# Patient Record
Sex: Female | Born: 1975 | Race: Black or African American | Hispanic: No | Marital: Married | Smoking: Never smoker
Health system: Southern US, Community
[De-identification: ages and names within clinical notes are randomized; demographics above are authoritative.]

## PROBLEM LIST (undated history)

## (undated) DIAGNOSIS — E785 Hyperlipidemia, unspecified: Secondary | ICD-10-CM

## (undated) DIAGNOSIS — Z973 Presence of spectacles and contact lenses: Secondary | ICD-10-CM

## (undated) DIAGNOSIS — Z9109 Other allergy status, other than to drugs and biological substances: Secondary | ICD-10-CM

## (undated) DIAGNOSIS — D509 Iron deficiency anemia, unspecified: Secondary | ICD-10-CM

## (undated) DIAGNOSIS — N840 Polyp of corpus uteri: Secondary | ICD-10-CM

## (undated) DIAGNOSIS — K219 Gastro-esophageal reflux disease without esophagitis: Secondary | ICD-10-CM

## (undated) DIAGNOSIS — E119 Type 2 diabetes mellitus without complications: Secondary | ICD-10-CM

## (undated) DIAGNOSIS — D649 Anemia, unspecified: Secondary | ICD-10-CM

## (undated) HISTORY — PX: TONSILLECTOMY AND ADENOIDECTOMY: SHX28

## (undated) HISTORY — DX: Hyperlipidemia, unspecified: E78.5

## (undated) HISTORY — PX: DILATION AND CURETTAGE OF UTERUS: SHX78

## (undated) HISTORY — PX: LAPAROSCOPIC CHOLECYSTECTOMY: SUR755

## (undated) HISTORY — DX: Morbid (severe) obesity due to excess calories: E66.01

## (undated) HISTORY — PX: HYSTEROSCOPY WITH RESECTOSCOPE: SHX5395

## (undated) HISTORY — DX: Polyp of corpus uteri: N84.0

## (undated) HISTORY — PX: CHOLECYSTECTOMY: SHX55

---

## 2003-03-02 ENCOUNTER — Other Ambulatory Visit: Admission: RE | Admit: 2003-03-02 | Discharge: 2003-03-02 | Payer: Self-pay | Admitting: Obstetrics and Gynecology

## 2008-09-28 ENCOUNTER — Encounter (INDEPENDENT_AMBULATORY_CARE_PROVIDER_SITE_OTHER): Payer: Self-pay | Admitting: General Surgery

## 2008-09-28 ENCOUNTER — Ambulatory Visit (HOSPITAL_COMMUNITY): Admission: RE | Admit: 2008-09-28 | Discharge: 2008-09-28 | Payer: Self-pay | Admitting: General Surgery

## 2009-06-22 IMAGING — RF DG CHOLANGIOGRAM OPERATIVE
1 series · 8 of 8 positions shown · non-contrast
Comparison: None available.

CLINICAL DATA: :  Cholelithiasis.  Cholecystectomy.

INTRAOPERATIVE CHOLANGIOGRAM
TECHNIQUE: Multiple fluoroscopic spot radiographs were obtained
during intraoperative cholangiogram and are submitted for
interpretation post-operatively.

[Series 0: selected · 8 of 8 slices shown]
[im 1/8]
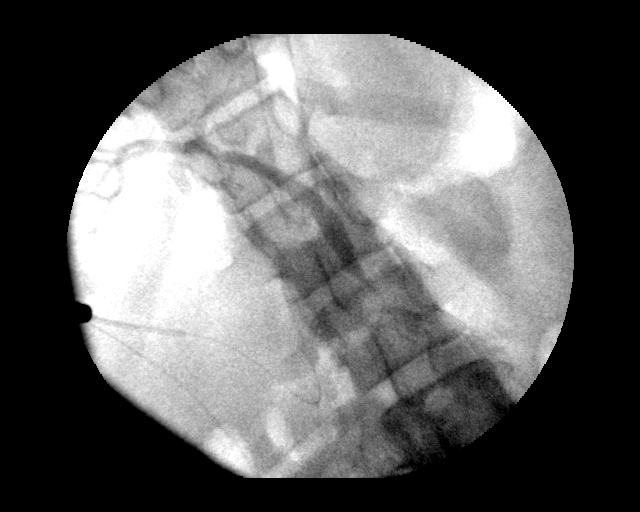
[im 2/8]
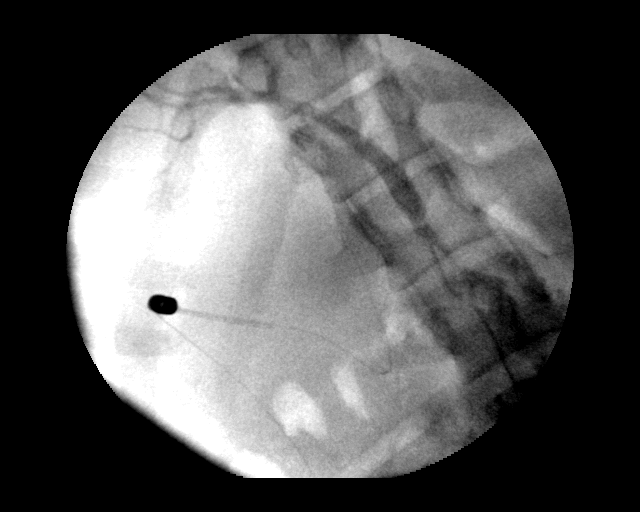
[im 3/8]
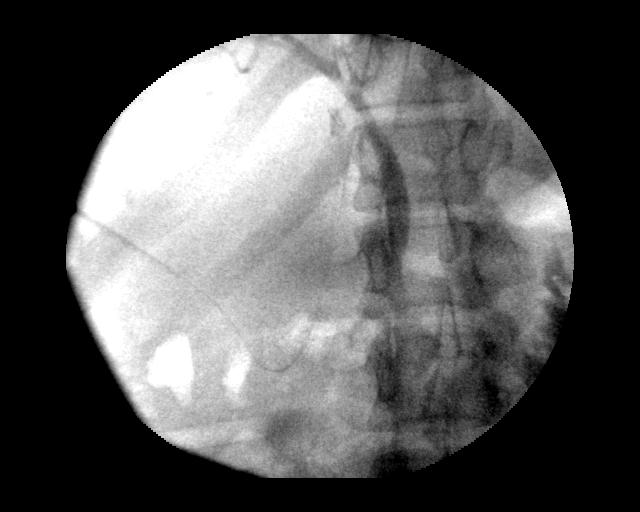
[im 4/8]
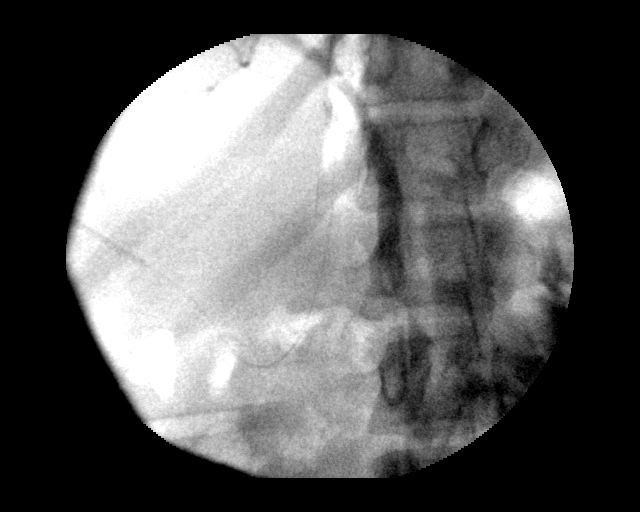
[im 5/8]
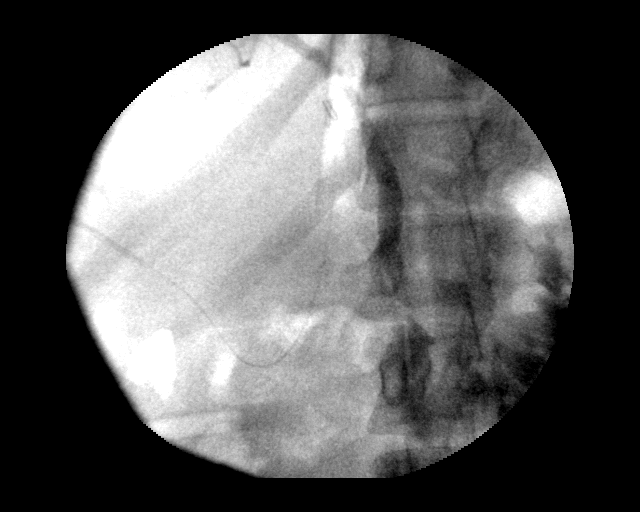
[im 6/8]
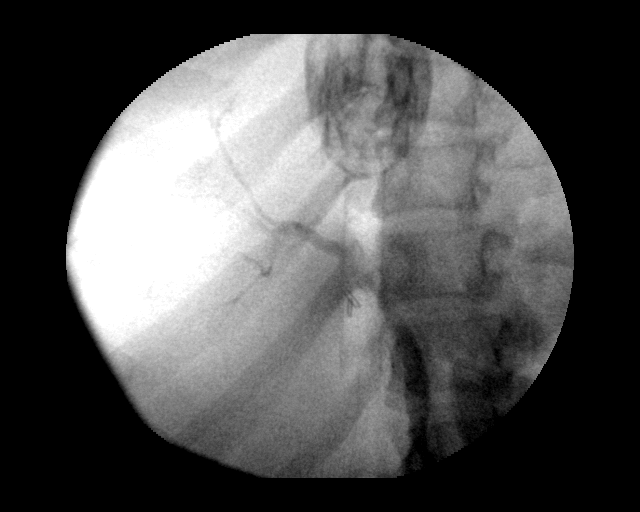
[im 7/8]
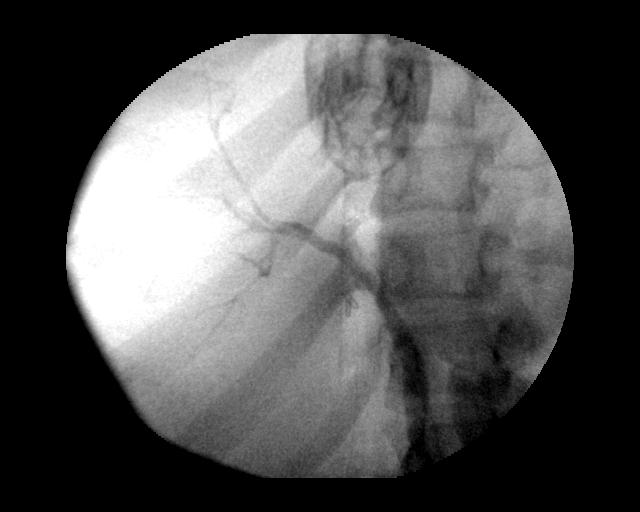
[im 8/8]
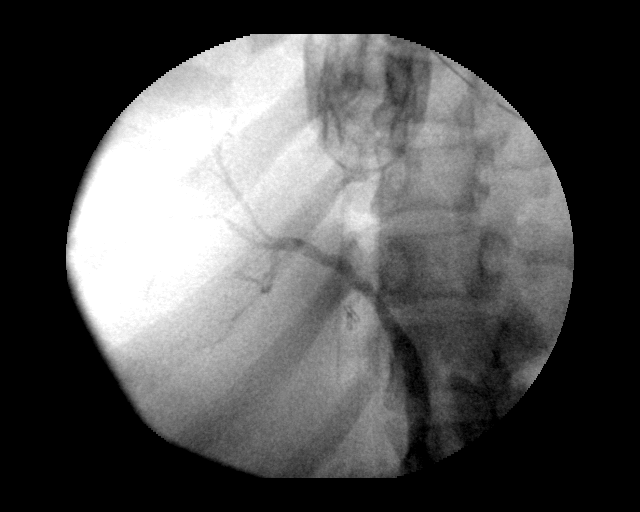

[8 of 8 positions shown; findings below may reference images not displayed]

FINDINGS: We are provided with eight fluoroscopic spot views from
intraoperative cholangiogram.  The cystic duct is cannulated.  No
biliary ductal dilatation or filling defect is identified.  No
leakage of contrast is seen.
IMPRESSION: Intraoperative cholangiogram without complicating features.

## 2009-11-11 ENCOUNTER — Ambulatory Visit (HOSPITAL_COMMUNITY): Admission: RE | Admit: 2009-11-11 | Discharge: 2009-11-11 | Payer: Self-pay | Admitting: Obstetrics and Gynecology

## 2010-02-04 ENCOUNTER — Encounter: Admission: RE | Admit: 2010-02-04 | Discharge: 2010-02-04 | Payer: Self-pay | Admitting: Obstetrics and Gynecology

## 2010-12-04 LAB — CBC
HCT: 30 % — ABNORMAL LOW (ref 36.0–46.0)
Hemoglobin: 9.4 g/dL — ABNORMAL LOW (ref 12.0–15.0)
RDW: 16.9 % — ABNORMAL HIGH (ref 11.5–15.5)
WBC: 12.7 10*3/uL — ABNORMAL HIGH (ref 4.0–10.5)

## 2010-12-29 LAB — COMPREHENSIVE METABOLIC PANEL
ALT: 14 U/L (ref 0–35)
AST: 18 U/L (ref 0–37)
BUN: 5 mg/dL — ABNORMAL LOW (ref 6–23)
CO2: 24 mEq/L (ref 19–32)
Chloride: 107 mEq/L (ref 96–112)
GFR calc Af Amer: >60 mL/min (ref >60)
Sodium: 136 mEq/L (ref 135–145)
Total Bilirubin: 0.7 mg/dL (ref 0.3–1.2)
Total Protein: 6.4 g/dL (ref 6.0–8.3)

## 2010-12-29 LAB — CBC
HCT: 36.2 % (ref 36.0–46.0)
Hemoglobin: 11.3 g/dL — ABNORMAL LOW (ref 12.0–15.0)
MCHC: 31.2 g/dL (ref 30.0–36.0)
Platelets: 345 10*3/uL (ref 150–400)

## 2011-01-20 ENCOUNTER — Other Ambulatory Visit: Payer: Self-pay | Admitting: Obstetrics and Gynecology

## 2011-01-20 DIAGNOSIS — Z1231 Encounter for screening mammogram for malignant neoplasm of breast: Secondary | ICD-10-CM

## 2011-01-27 NOTE — Op Note (Signed)
Tracy Henry, Tracy Henry                  ACCOUNT NO.:  [id number]   MEDICAL RECORD NO.:  [id number]          PATIENT TYPE:  AMB   LOCATION:  SDS                          FACILITY:  [facility]   PHYSICIAN:  Gabrielle Dare. Janee Morn, M.D.DATE OF BIRTH:  05-04-1976   DATE OF PROCEDURE:  09/28/2008  DATE OF DISCHARGE:                               OPERATIVE REPORT   PREOPERATIVE DIAGNOSIS:  Symptomatic cholelithiasis.   POSTOPERATIVE DIAGNOSIS:  Symptomatic cholelithiasis.   PROCEDURE:  Laparoscopic cholecystectomy with intraoperative  cholangiogram.   SURGEON:  Gabrielle Dare. Janee Morn, MD   ASSISTANT:  Wende Neighbors, P.A.   ANESTHESIA:  General.   HISTORY OF PRESENT ILLNESS:  Tracy Henry is a 35 year old African American  female who I evaluated in the office for right upper quadrant abdominal  pain and gallstones.  She presents today for cholecystectomy.   PROCEDURE IN DETAIL:  Informed consent was obtained.  The patient was  identified in the preop holding area.  She received intravenous  antibiotics.  She was brought to the operating room.  General  endotracheal anesthesia was administered by the anesthesia staff.  Her  abdomen was prepped and draped in a sterile fashion.  Infraumbilical  region was infiltrated with 0.50% Marcaine with epinephrine.  Infraumbilical incision was made.  Subcutaneous tissues were dissected  down revealing the anterior fashion.  This was divided sharply, and the  peritoneal cavity was entered under direct vision without difficulty.  A  0 Vicryl pursestring suture was placed around the fascial opening and  the Hasson trocar was inserted into the abdomen.  Under direct vision,  an 11-mm epigastric and two 5-mm lateral ports were placed.  Marcaine  .50% with epinephrine was used in all port sites.  Laparoscopic  exploration revealed a gallbladder with several stones.  There were a  few filmy adhesions attached to the gallbladder.  The dome of the  gallbladder was retracted  superomedially and adhesions were gently taken  down and the infundibulum was retracted inferolaterally.  There was  evidence of chronic inflammation.  Dissection began laterally and  progressed medially easily identifying the cystic duct.  Dissection  continued until a large window was created between the infundibulum of  the gallbladder and cystic duct in the liver. Once this was accomplished  with excellent visualization, a clip was placed on the infundibular  cystic duct junction.  A small nick was made in the cystic duct and a  cholangiogram catheter was inserted.  Intraoperative cholangiogram was  obtained.  The images were suboptimal; however, no filling defects were  noted in the common bile duct.  The cystic duct was narrow caliber.  The  cholangiogram was completed and catheter was removed.  Three clips were  placed proximally on the cystic duct and it was divided.  Further  dissection revealed a small anterior branch of the cystic artery.  This  was clipped twice proximally and once distally and divided.  Further  dissection revealed the main branch of the cystic artery.  This was  divided twice proximally and once distally and divided.  The gallbladder  was taken off the liver bed with Bovie cautery achieving excellent  hemostasis.  The liver bed was dry.  The gallbladder was placed in an  EndoCatch bag and removed from the abdomen via the infraumbilical port  site.  The abdomen was copiously irrigated.  The irrigation fluid was  returned clear.  Liver bed was rechecked and was completely dry.  Clips  remained in excellent position.  Ports were then removed under direct  vision.  A Hasson trocar was removed and pneumoperitoneum was released.  The infraumbilical fascia was closed at that time with 0-Vicryl  pursestring suture with care not to trap any  intra-abdominal contents and the skin of all 4 wounds was closed with a  running 4-0 Vicryl subcuticular stitch followed by  Dermabond.  Sponge,  needle, and instrument counts were all correct. The patient tolerated  the procedure well without apparent complications. The patient was taken  to the recovery room in stable condition.      Gabrielle Dare Janee Morn, M.D.  Electronically Signed     BET/MEDQ  D:  09/28/2008  T:  09/28/2008  Job:  [id number]   cc:   Lenoard Aden, M.D.

## 2011-01-30 ENCOUNTER — Other Ambulatory Visit: Payer: Self-pay | Admitting: Obstetrics and Gynecology

## 2011-02-10 ENCOUNTER — Ambulatory Visit
Admission: RE | Admit: 2011-02-10 | Discharge: 2011-02-10 | Disposition: A | Discharge status: Home / Self Care | Payer: 59 | Source: Ambulatory Visit | Attending: Obstetrics and Gynecology | Admitting: Obstetrics and Gynecology

## 2011-02-10 DIAGNOSIS — Z1231 Encounter for screening mammogram for malignant neoplasm of breast: Secondary | ICD-10-CM

## 2012-01-14 ENCOUNTER — Other Ambulatory Visit: Payer: Self-pay | Admitting: Obstetrics and Gynecology

## 2012-01-14 DIAGNOSIS — Z1231 Encounter for screening mammogram for malignant neoplasm of breast: Secondary | ICD-10-CM

## 2012-02-11 ENCOUNTER — Ambulatory Visit
Admission: RE | Admit: 2012-02-11 | Discharge: 2012-02-11 | Disposition: A | Discharge status: Home / Self Care | Payer: 59 | Source: Ambulatory Visit | Attending: Obstetrics and Gynecology | Admitting: Obstetrics and Gynecology

## 2012-02-11 DIAGNOSIS — Z1231 Encounter for screening mammogram for malignant neoplasm of breast: Secondary | ICD-10-CM

## 2013-01-25 ENCOUNTER — Other Ambulatory Visit: Payer: Self-pay | Admitting: Obstetrics and Gynecology

## 2013-01-25 DIAGNOSIS — Z1231 Encounter for screening mammogram for malignant neoplasm of breast: Secondary | ICD-10-CM

## 2013-01-25 DIAGNOSIS — Z803 Family history of malignant neoplasm of breast: Secondary | ICD-10-CM

## 2013-02-28 ENCOUNTER — Ambulatory Visit
Admission: RE | Admit: 2013-02-28 | Discharge: 2013-02-28 | Disposition: A | Discharge status: Home / Self Care | Payer: 59 | Source: Ambulatory Visit | Attending: Obstetrics and Gynecology | Admitting: Obstetrics and Gynecology

## 2013-02-28 DIAGNOSIS — Z1231 Encounter for screening mammogram for malignant neoplasm of breast: Secondary | ICD-10-CM

## 2013-02-28 DIAGNOSIS — Z803 Family history of malignant neoplasm of breast: Secondary | ICD-10-CM

## 2014-12-06 ENCOUNTER — Encounter (INDEPENDENT_AMBULATORY_CARE_PROVIDER_SITE_OTHER): Payer: Self-pay

## 2014-12-06 ENCOUNTER — Encounter: Payer: Self-pay | Admitting: Primary Care

## 2014-12-06 ENCOUNTER — Ambulatory Visit (INDEPENDENT_AMBULATORY_CARE_PROVIDER_SITE_OTHER): Payer: 59 | Admitting: Primary Care

## 2014-12-06 VITALS — BP 126/80 | HR 67 | Temp 97.4°F | Ht 61.0 in | Wt 253.8 lb

## 2014-12-06 DIAGNOSIS — E669 Obesity, unspecified: Secondary | ICD-10-CM | POA: Diagnosis not present

## 2014-12-06 DIAGNOSIS — K219 Gastro-esophageal reflux disease without esophagitis: Secondary | ICD-10-CM

## 2014-12-06 DIAGNOSIS — E785 Hyperlipidemia, unspecified: Secondary | ICD-10-CM | POA: Insufficient documentation

## 2014-12-06 NOTE — Progress Notes (Signed)
Pre visit review using our clinic review tool, if applicable. No additional management support is needed unless otherwise documented below in the visit note. 

## 2014-12-06 NOTE — Assessment & Plan Note (Signed)
Body mass index is 47.98 kg/(m^2).  Discussed importance of healthy diet and regular exercise. She is going to start working on this and we are to follow up in one month.

## 2014-12-06 NOTE — Assessment & Plan Note (Signed)
Stable with infrequent symptoms of belching and burning. Symptoms managed with Zantac 150mg . Education provided on foods to avoid that can trigger symptoms.

## 2014-12-06 NOTE — Assessment & Plan Note (Signed)
Will obtain old records for lab values She is not on any medication and does not comply to a healthy diet or exercise regimen. Will continue to monitor and start medication if necessary.

## 2014-12-06 NOTE — Progress Notes (Signed)
Subjective:    Patient ID: Tracy Henry, female    DOB: 01-29-1976, 39 y.o.   MRN: [id number]  HPI  Tracy Henry is a 39 year old female who presents today to establish care and discuss the problems mentioned below. Will obtain old records.  1) Hyperlipidemia: She was first diagnosed in high school, and currently gets her labs drawn, including lipid panel, every year in the summer from work. Her prior lipid panel was drawn last summer and her cholseterol was "high". She is currently not taking any medication, but took a class for hyperlipemia through her job ([employer]). She currently doesn't exercise daily but will walk once or twice a week for 30 minutes. She does not follow a healthy diet of fruits and vegetables but does cook at home. She was participating in [employer], but hasn't since December of 2015.  2) Family History of Breast Cancer: She has a twin sister who was diagnosed at age 31. The patient's last mammogram was in 2014, and they told her to follow up this year. She plans to schedule her next mammogram through her GYN office because they do them there in the office. She currently is a patient of  Dr.Taavon.  3) GERD: Occasional reflux symptoms of belching and reflux with burning of gastric contents. She manages with Zantac 150mg  and will take it only when symptoms are present. Symptoms are currently infrequent.   Review of Systems  Constitutional: Positive for fatigue. Negative for unexpected weight change.  HENT: Negative for rhinorrhea.   Respiratory: Negative for cough and shortness of breath.   Cardiovascular: Negative for chest pain and leg swelling.  Gastrointestinal: Negative for diarrhea and constipation.  Endocrine: Negative for polydipsia, polyphagia and polyuria.  Genitourinary: Negative for dysuria and frequency.  Musculoskeletal: Negative for myalgias and arthralgias.  Skin: Negative for rash.  Neurological: Negative for dizziness and headaches.    Psychiatric/Behavioral:       Denies concerns for anxiety or depression       Past Medical History  Diagnosis Date  . Hyperlipidemia   . Uterine polyp     Removed in 2010    History   Social History  . Marital Status: Married    Spouse Name: N/A  . Number of Children: N/A  . Years of Education: N/A   Occupational History  . Not on file.   Social History Main Topics  . Smoking status: Never Smoker   . Smokeless tobacco: Not on file  . Alcohol Use: No  . Drug Use: No  . Sexual Activity: Not on file   Other Topics Concern  . Not on file   Social History Narrative   Married for 17 years    No children   Work for [employer] for 17 years   Enjoys reading   From [place]    Past Surgical History  Procedure Laterality Date  . Cholecystectomy  around 2009  . Tonsillectomy and adenoidectomy  1984 or 1985    Family History  Problem Relation Age of Onset  . Hypertension Father   . Cancer Paternal Grandmother     Ovarian cancer, breast?  . Hypertension Mother   . Breast cancer Sister 28    No Known Allergies  No current outpatient prescriptions on file prior to visit.   No current facility-administered medications on file prior to visit.    BP 126/80 mmHg  Pulse 67  Temp(Src) 97.4 F (36.3 C) (Oral)  Ht 5\' 1"  (1.549 m)  Wt 253 lb 12.8 oz (115.123 kg)  BMI 47.98 kg/m2  SpO2 98%  LMP 12/02/2014    Objective:   Physical Exam  Constitutional: She is oriented to person, place, and time. She appears well-developed.  HENT:  Head: Normocephalic.  Right Ear: Tympanic membrane is not erythematous and not bulging.  Left Ear: Tympanic membrane is not erythematous and not bulging.  Eyes: Conjunctivae and EOM are normal. Pupils are equal, round, and reactive to light.  Neck: Neck supple.  Cardiovascular: Normal rate and regular rhythm.   Pulmonary/Chest: Effort normal and breath sounds normal.  Abdominal: Soft. Bowel sounds are normal. There is no  tenderness.  Musculoskeletal: Normal range of motion.  Lymphadenopathy:    She has no cervical adenopathy.  Neurological: She is alert and oriented to person, place, and time.  Skin: Skin is warm and dry.  Psychiatric: She has a normal mood and affect.          Assessment & Plan:

## 2014-12-06 NOTE — Patient Instructions (Signed)
Start your weight loss efforts of weight watchers, healthy diet, and incorporate some daily exercise. Schedule a fasting physical with me in one month and we will follow up with your weight loss efforts. Welcome to [facility]!  Gastroesophageal Reflux Disease, Adult Gastroesophageal reflux disease (GERD) happens when acid from your stomach flows up into the esophagus. When acid comes in contact with the esophagus, the acid causes soreness (inflammation) in the esophagus. Over time, GERD may create small holes (ulcers) in the lining of the esophagus. CAUSES   Increased body weight. This puts pressure on the stomach, making acid rise from the stomach into the esophagus.  Smoking. This increases acid production in the stomach.  Drinking alcohol. This causes decreased pressure in the lower esophageal sphincter (valve or ring of muscle between the esophagus and stomach), allowing acid from the stomach into the esophagus.  Late evening meals and a full stomach. This increases pressure and acid production in the stomach.  A malformed lower esophageal sphincter. Sometimes, no cause is found. SYMPTOMS   Burning pain in the lower part of the mid-chest behind the breastbone and in the mid-stomach area. This may occur twice a week or more often.  Trouble swallowing.  Sore throat.  Dry cough.  Asthma-like symptoms including chest tightness, shortness of breath, or wheezing. DIAGNOSIS  Your caregiver may be able to diagnose GERD based on your symptoms. In some cases, X-rays and other tests may be done to check for complications or to check the condition of your stomach and esophagus. TREATMENT  Your caregiver may recommend over-the-counter or prescription medicines to help decrease acid production. Ask your caregiver before starting or adding any new medicines.  HOME CARE INSTRUCTIONS   Change the factors that you can control. Ask your caregiver for guidance concerning weight loss, quitting  smoking, and alcohol consumption.  Avoid foods and drinks that make your symptoms worse, such as:  Caffeine or alcoholic drinks.  Chocolate.  Peppermint or mint flavorings.  Garlic and onions.  Spicy foods.  Citrus fruits, such as oranges, lemons, or limes.  Tomato-based foods such as sauce, chili, salsa, and pizza.  Fried and fatty foods.  Avoid lying down for the 3 hours prior to your bedtime or prior to taking a nap.  Eat small, frequent meals instead of large meals.  Wear loose-fitting clothing. Do not wear anything tight around your waist that causes pressure on your stomach.  Raise the head of your bed 6 to 8 inches with wood blocks to help you sleep. Extra pillows will not help.  Only take over-the-counter or prescription medicines for pain, discomfort, or fever as directed by your caregiver.  Do not take aspirin, ibuprofen, or other nonsteroidal anti-inflammatory drugs (NSAIDs). SEEK IMMEDIATE MEDICAL CARE IF:   You have pain in your arms, neck, jaw, teeth, or back.  Your pain increases or changes in intensity or duration.  You develop nausea, vomiting, or sweating (diaphoresis).  You develop shortness of breath, or you faint.  Your vomit is green, yellow, black, or looks like coffee grounds or blood.  Your stool is red, bloody, or black. These symptoms could be signs of other problems, such as heart disease, gastric bleeding, or esophageal bleeding. MAKE SURE YOU:   Understand these instructions.  Will watch your condition.  Will get help right away if you are not doing well or get worse. Document Released: 06/10/2005 Document Revised: 11/23/2011 Document Reviewed: 03/20/2011 [facility] Patient Information 2015 [place], [place]. This information is not intended to replace  advice given to you by your health care provider. Make sure you discuss any questions you have with your health care provider.  

## 2015-01-09 ENCOUNTER — Encounter: Payer: Self-pay | Admitting: Primary Care

## 2015-01-09 ENCOUNTER — Ambulatory Visit (INDEPENDENT_AMBULATORY_CARE_PROVIDER_SITE_OTHER): Payer: 59 | Admitting: Primary Care

## 2015-01-09 VITALS — BP 124/78 | HR 76 | Temp 98.4°F | Ht 61.25 in | Wt 254.8 lb

## 2015-01-09 DIAGNOSIS — Z0000 Encounter for general adult medical examination without abnormal findings: Secondary | ICD-10-CM | POA: Insufficient documentation

## 2015-01-09 LAB — LIPID PANEL
CHOLESTEROL: 165 mg/dL (ref 0–200)
HDL: 34.3 mg/dL — ABNORMAL LOW (ref >39.00)
LDL CALC: 107 mg/dL — AB (ref 0–99)
NonHDL: 130.7
TRIGLYCERIDES: 119 mg/dL (ref 0.0–149.0)
Total CHOL/HDL Ratio: 5
VLDL: 23.8 mg/dL (ref 0.0–40.0)

## 2015-01-09 LAB — CBC WITH DIFFERENTIAL/PLATELET
BASOS PCT: 0.3 % (ref 0.0–3.0)
Basophils Absolute: 0 10*3/uL (ref 0.0–0.1)
EOS PCT: 1.9 % (ref 0.0–5.0)
Eosinophils Absolute: 0.2 10*3/uL (ref 0.0–0.7)
HEMATOCRIT: 39.3 % (ref 36.0–46.0)
HEMOGLOBIN: 13.1 g/dL (ref 12.0–15.0)
LYMPHS ABS: 2.6 10*3/uL (ref 0.7–4.0)
Lymphocytes Relative: 26.7 % (ref 12.0–46.0)
MCHC: 33.2 g/dL (ref 30.0–36.0)
MCV: 77.8 fl — ABNORMAL LOW (ref 78.0–100.0)
MONOS PCT: 7.3 % (ref 3.0–12.0)
Monocytes Absolute: 0.7 10*3/uL (ref 0.1–1.0)
Neutro Abs: 6.2 10*3/uL (ref 1.4–7.7)
Neutrophils Relative %: 63.8 % (ref 43.0–77.0)
PLATELETS: 326 10*3/uL (ref 150.0–400.0)
RBC: 5.05 Mil/uL (ref 3.87–5.11)
RDW: 15.5 % (ref 11.5–15.5)
WBC: 9.7 10*3/uL (ref 4.0–10.5)

## 2015-01-09 LAB — COMPREHENSIVE METABOLIC PANEL
ALBUMIN: 3.9 g/dL (ref 3.5–5.2)
ALK PHOS: 73 U/L (ref 39–117)
ALT: 14 U/L (ref 0–35)
AST: 15 U/L (ref 0–37)
BUN: 13 mg/dL (ref 6–23)
CALCIUM: 9.1 mg/dL (ref 8.4–10.5)
CO2: 27 meq/L (ref 19–32)
Chloride: 104 mEq/L (ref 96–112)
Creatinine, Ser: 1.21 mg/dL — ABNORMAL HIGH (ref 0.40–1.20)
GFR: 63.45 mL/min (ref >60.00)
Glucose, Bld: 99 mg/dL (ref 70–99)
Potassium: 4.1 mEq/L (ref 3.5–5.1)
SODIUM: 135 meq/L (ref 135–145)
Total Bilirubin: 0.4 mg/dL (ref 0.2–1.2)
Total Protein: 7 g/dL (ref 6.0–8.3)

## 2015-01-09 LAB — HEMOGLOBIN A1C: HEMOGLOBIN A1C: 6 % (ref 4.6–6.5)

## 2015-01-09 LAB — TSH: TSH: 1.71 u[IU]/mL (ref 0.35–4.50)

## 2015-01-09 NOTE — Progress Notes (Signed)
Subjective:    Patient ID: Tracy Henry, female    DOB: 12/26/1975, 39 y.o.   MRN: [id number]  HPI  Tracy Henry is a 39 year old female who presents today for complete physical.  Immunizations: -Tetanus: Administered by Dr. Kennith Maes office. -Influenza: Did not get this past season.  Diet: Since her last visit one month ago she reports to be incorporating more fruits, vegetables, salads, lean meats into her diet. She is preparing more meals at home, not eating fast food as much. She is drinking water mainly with some diet soda. No sweet tea or regular sodas.  Wt Readings from Last 3 Encounters:  01/09/15 254 lb 12 oz (115.554 kg)  12/06/14 253 lb 12.8 oz (115.123 kg)   Exercise: She's not regularly exercising.  Eye exam: 3 weeks ago, no changes in her prescription. Dental exam: She has not completed in two years, but plans on a cleaning this year.  Pap Smear: Last Pap was 03/2014. Annual exam this summer. Mammogram: Plans on scheduling this year with OB/GYN since they can do the 3D imaging in their office. She prefers to obtain her mammogram there. FH of breast cancer and is due for re-evaluation. Last mammogram was in 2014.   Review of Systems  Constitutional: Negative for unexpected weight change.  HENT: Negative for rhinorrhea.   Eyes: Negative for visual disturbance.  Respiratory: Negative for cough and shortness of breath.   Cardiovascular: Negative for chest pain.  Gastrointestinal: Negative for diarrhea and constipation.  Genitourinary: Negative for dysuria and frequency.  Musculoskeletal: Negative for myalgias and arthralgias.  Skin: Negative for rash.  Allergic/Immunologic:       Seasonal allergies  Neurological: Negative for dizziness and headaches.  Hematological: Negative for adenopathy.  Psychiatric/Behavioral:       Denies concerns for anxiety or depression       Past Medical History  Diagnosis Date  . Hyperlipidemia   . Uterine polyp     Removed in 2010     History   Social History  . Marital Status: Married    Spouse Name: N/A  . Number of Children: N/A  . Years of Education: N/A   Occupational History  . Not on file.   Social History Main Topics  . Smoking status: Never Smoker   . Smokeless tobacco: Not on file  . Alcohol Use: No  . Drug Use: No  . Sexual Activity: Not on file   Other Topics Concern  . Not on file   Social History Narrative   Married for 17 years    No children   Work for [employer] for 17 years   Enjoys reading   From [place]    Past Surgical History  Procedure Laterality Date  . Cholecystectomy  around 2009  . Tonsillectomy and adenoidectomy  1984 or 1985    Family History  Problem Relation Age of Onset  . Hypertension Father   . Cancer Paternal Grandmother     Ovarian cancer, breast?  . Hypertension Mother   . Breast cancer Sister 67    No Known Allergies  No current outpatient prescriptions on file prior to visit.   No current facility-administered medications on file prior to visit.    BP 124/78 mmHg  Pulse 76  Temp(Src) 98.4 F (36.9 C) (Oral)  Ht 5' 1.25" (1.556 m)  Wt 254 lb 12 oz (115.554 kg)  BMI 47.73 kg/m2  SpO2 97%  LMP 12/16/2014    Objective:   Physical Exam  Constitutional: She is oriented to person, place, and time. She appears well-developed.  HENT:  Right Ear: Tympanic membrane and ear canal normal.  Left Ear: Tympanic membrane and ear canal normal.  Nose: Nose normal.  Mouth/Throat: Oropharynx is clear and moist.  Eyes: Conjunctivae and EOM are normal. Pupils are equal, round, and reactive to light.  Neck: Neck supple. No thyromegaly present.  Cardiovascular: Normal rate, regular rhythm and normal heart sounds.   No murmur heard. Pulmonary/Chest: Effort normal and breath sounds normal.  Abdominal: Soft. Bowel sounds are normal.  Musculoskeletal: Normal range of motion.  Lymphadenopathy:    She has no cervical adenopathy.  Neurological: She is  alert and oriented to person, place, and time. No cranial nerve deficit. Coordination normal.  Skin: Skin is warm and dry.  Psychiatric: She has a normal mood and affect.          Assessment & Plan:

## 2015-01-09 NOTE — Assessment & Plan Note (Signed)
Unremarkable exam. Stressed the importance of healthy diet and exercise. She had elevated cholesterol in 04/2014 from her occupation. She is not taking any medication or exercising. Will include lipids in today's panel. A1C was noted to be 6.1 from 04/2014. Will repeat today. Again, stressed healthy diet and exercise. Will follow closely with her weight as she has gained one pound since last visit. Obtains Paps and annual pelvic exams at OB/GYN.

## 2015-01-09 NOTE — Progress Notes (Signed)
Pre visit review using our clinic review tool, if applicable. No additional management support is needed unless otherwise documented below in the visit note. 

## 2015-01-09 NOTE — Patient Instructions (Signed)
Complete lab work prior to leaving today. I will notify you of your results. Continue your efforts on improving your diet to include fresh fruits and vegetables; lean meats that are grilled, and whole grains. Reduce carbohydrates such as potatoes, white rice, breads, sugars. You should be getting 30-45 minutes of exercise daily for 5 days a week. Follow up 3-4 months for re-evaluation of weight. Please call me if you have any questions! It was great seeing you!

## 2015-01-10 ENCOUNTER — Encounter: Payer: 59 | Admitting: Primary Care

## 2015-03-14 ENCOUNTER — Encounter (HOSPITAL_COMMUNITY): Payer: Self-pay | Admitting: *Deleted

## 2015-03-19 ENCOUNTER — Other Ambulatory Visit: Payer: Self-pay | Admitting: Obstetrics and Gynecology

## 2015-04-01 MED ORDER — CEFAZOLIN SODIUM-DEXTROSE 2-3 GM-% IV SOLR
2.0000 g | INTRAVENOUS | Status: AC
  Administered 2015-04-02: 2 g via INTRAVENOUS

## 2015-04-02 ENCOUNTER — Ambulatory Visit (HOSPITAL_COMMUNITY): Payer: 59 | Admitting: Anesthesiology

## 2015-04-02 ENCOUNTER — Ambulatory Visit (HOSPITAL_COMMUNITY)
Admission: RE | Admit: 2015-04-02 | Discharge: 2015-04-02 | Disposition: A | Discharge status: Home / Self Care | Payer: 59 | Source: Ambulatory Visit | Attending: Obstetrics and Gynecology | Admitting: Obstetrics and Gynecology

## 2015-04-02 ENCOUNTER — Encounter (HOSPITAL_COMMUNITY)
Admission: RE | Disposition: A | Discharge status: Home / Self Care | Payer: Self-pay | Source: Ambulatory Visit | Attending: Obstetrics and Gynecology

## 2015-04-02 ENCOUNTER — Encounter (HOSPITAL_COMMUNITY): Payer: Self-pay | Admitting: *Deleted

## 2015-04-02 DIAGNOSIS — D649 Anemia, unspecified: Secondary | ICD-10-CM | POA: Diagnosis not present

## 2015-04-02 DIAGNOSIS — Z6841 Body Mass Index (BMI) 40.0 and over, adult: Secondary | ICD-10-CM | POA: Diagnosis not present

## 2015-04-02 DIAGNOSIS — N840 Polyp of corpus uteri: Secondary | ICD-10-CM | POA: Insufficient documentation

## 2015-04-02 DIAGNOSIS — N939 Abnormal uterine and vaginal bleeding, unspecified: Secondary | ICD-10-CM | POA: Diagnosis present

## 2015-04-02 DIAGNOSIS — Z79899 Other long term (current) drug therapy: Secondary | ICD-10-CM | POA: Diagnosis not present

## 2015-04-02 DIAGNOSIS — K219 Gastro-esophageal reflux disease without esophagitis: Secondary | ICD-10-CM | POA: Diagnosis not present

## 2015-04-02 HISTORY — DX: Gastro-esophageal reflux disease without esophagitis: K21.9

## 2015-04-02 HISTORY — PX: DILATATION & CURRETTAGE/HYSTEROSCOPY WITH RESECTOCOPE: SHX5572

## 2015-04-02 HISTORY — DX: Anemia, unspecified: D64.9

## 2015-04-02 LAB — CBC
HCT: 39.7 % (ref 36.0–46.0)
HEMOGLOBIN: 13 g/dL (ref 12.0–15.0)
MCH: 26.6 pg (ref 26.0–34.0)
MCHC: 32.7 g/dL (ref 30.0–36.0)
MCV: 81.4 fL (ref 78.0–100.0)
Platelets: 305 10*3/uL (ref 150–400)
RBC: 4.88 MIL/uL (ref 3.87–5.11)
RDW: 14.7 % (ref 11.5–15.5)
WBC: 8.6 10*3/uL (ref 4.0–10.5)

## 2015-04-02 LAB — HCG, SERUM, QUALITATIVE: Preg, Serum: NEGATIVE

## 2015-04-02 SURGERY — DILATATION & CURETTAGE/HYSTEROSCOPY WITH RESECTOCOPE
Anesthesia: General | Site: Vagina

## 2015-04-02 MED ORDER — CEFAZOLIN SODIUM-DEXTROSE 2-3 GM-% IV SOLR
INTRAVENOUS | Status: AC
  Filled 2015-04-02: qty 50

## 2015-04-02 MED ORDER — DEXAMETHASONE SODIUM PHOSPHATE 4 MG/ML IJ SOLN
INTRAMUSCULAR | Status: AC
  Filled 2015-04-02: qty 1

## 2015-04-02 MED ORDER — ONDANSETRON HCL 4 MG/2ML IJ SOLN
INTRAMUSCULAR | Status: AC
  Filled 2015-04-02: qty 2

## 2015-04-02 MED ORDER — GLYCINE 1.5 % IR SOLN
Status: DC | PRN
  Administered 2015-04-02: 3000 mL

## 2015-04-02 MED ORDER — HYDROCODONE-IBUPROFEN 7.5-200 MG PO TABS: 1.0000 | ORAL_TABLET | Freq: Three times a day (TID) | ORAL | Status: DC | PRN

## 2015-04-02 MED ORDER — LIDOCAINE HCL (CARDIAC) 20 MG/ML IV SOLN
INTRAVENOUS | Status: DC | PRN
  Administered 2015-04-02 (×2): 50 mg via INTRAVENOUS

## 2015-04-02 MED ORDER — PROPOFOL 10 MG/ML IV BOLUS
INTRAVENOUS | Status: DC | PRN
  Administered 2015-04-02: 50 mg via INTRAVENOUS
  Administered 2015-04-02: 100 mg via INTRAVENOUS

## 2015-04-02 MED ORDER — FENTANYL CITRATE (PF) 100 MCG/2ML IJ SOLN: 25.0000 ug | INTRAMUSCULAR | Status: DC | PRN

## 2015-04-02 MED ORDER — MEPERIDINE HCL 25 MG/ML IJ SOLN: 6.2500 mg | INTRAMUSCULAR | Status: DC | PRN

## 2015-04-02 MED ORDER — SCOPOLAMINE 1 MG/3DAYS TD PT72
MEDICATED_PATCH | TRANSDERMAL | Status: AC
  Administered 2015-04-02: 1.5 mg via TRANSDERMAL
  Filled 2015-04-02: qty 1

## 2015-04-02 MED ORDER — MIDAZOLAM HCL 2 MG/2ML IJ SOLN
INTRAMUSCULAR | Status: AC
  Filled 2015-04-02: qty 2

## 2015-04-02 MED ORDER — GLYCOPYRROLATE 0.2 MG/ML IJ SOLN
INTRAMUSCULAR | Status: AC
  Filled 2015-04-02: qty 1

## 2015-04-02 MED ORDER — SCOPOLAMINE 1 MG/3DAYS TD PT72
1.0000 | MEDICATED_PATCH | Freq: Once | TRANSDERMAL | Status: DC
  Administered 2015-04-02: 1.5 mg via TRANSDERMAL

## 2015-04-02 MED ORDER — SODIUM CHLORIDE 0.9 % IJ SOLN
INTRAMUSCULAR | Status: AC
Start: 2015-04-02 — End: 2015-04-02
  Filled 2015-04-02: qty 50

## 2015-04-02 MED ORDER — FENTANYL CITRATE (PF) 100 MCG/2ML IJ SOLN
INTRAMUSCULAR | Status: DC | PRN
  Administered 2015-04-02: 100 ug via INTRAVENOUS

## 2015-04-02 MED ORDER — ONDANSETRON HCL 4 MG/2ML IJ SOLN: 4.0000 mg | Freq: Once | INTRAMUSCULAR | Status: DC | PRN

## 2015-04-02 MED ORDER — KETOROLAC TROMETHAMINE 30 MG/ML IJ SOLN
INTRAMUSCULAR | Status: DC | PRN
  Administered 2015-04-02: 30 mg via INTRAVENOUS

## 2015-04-02 MED ORDER — VASOPRESSIN 20 UNIT/ML IV SOLN
INTRAVENOUS | Status: AC
  Filled 2015-04-02: qty 1

## 2015-04-02 MED ORDER — PROPOFOL 10 MG/ML IV BOLUS
INTRAVENOUS | Status: AC
Start: 2015-04-02 — End: 2015-04-02
  Filled 2015-04-02: qty 20

## 2015-04-02 MED ORDER — CHLOROPROCAINE HCL 1 % IJ SOLN
INTRAMUSCULAR | Status: AC
Start: 2015-04-02 — End: 2015-04-02
  Filled 2015-04-02: qty 30

## 2015-04-02 MED ORDER — KETOROLAC TROMETHAMINE 30 MG/ML IJ SOLN: 30.0000 mg | Freq: Once | INTRAMUSCULAR | Status: DC | PRN

## 2015-04-02 MED ORDER — VASOPRESSIN 20 UNIT/ML IV SOLN
INTRAVENOUS | Status: DC | PRN
  Administered 2015-04-02: 20 [IU]

## 2015-04-02 MED ORDER — BUPIVACAINE HCL (PF) 0.25 % IJ SOLN
INTRAMUSCULAR | Status: DC | PRN
  Administered 2015-04-02: 10 mL

## 2015-04-02 MED ORDER — DEXAMETHASONE SODIUM PHOSPHATE 4 MG/ML IJ SOLN
INTRAMUSCULAR | Status: DC | PRN
  Administered 2015-04-02: 4 mg via INTRAVENOUS

## 2015-04-02 MED ORDER — BUPIVACAINE HCL (PF) 0.25 % IJ SOLN
INTRAMUSCULAR | Status: AC
  Filled 2015-04-02: qty 30

## 2015-04-02 MED ORDER — LIDOCAINE HCL (CARDIAC) 20 MG/ML IV SOLN
INTRAVENOUS | Status: AC
Start: 2015-04-02 — End: 2015-04-02
  Filled 2015-04-02: qty 5

## 2015-04-02 MED ORDER — LACTATED RINGERS IV SOLN
INTRAVENOUS | Status: DC
  Administered 2015-04-02 (×3): via INTRAVENOUS

## 2015-04-02 MED ORDER — MIDAZOLAM HCL 5 MG/5ML IJ SOLN
INTRAMUSCULAR | Status: DC | PRN
  Administered 2015-04-02: 2 mg via INTRAVENOUS

## 2015-04-02 MED ORDER — FENTANYL CITRATE (PF) 100 MCG/2ML IJ SOLN
INTRAMUSCULAR | Status: AC
  Filled 2015-04-02: qty 2

## 2015-04-02 MED ORDER — ONDANSETRON HCL 4 MG/2ML IJ SOLN
INTRAMUSCULAR | Status: DC | PRN
  Administered 2015-04-02: 4 mg via INTRAVENOUS

## 2015-04-02 MED ORDER — CHLOROPROCAINE HCL 1 % IJ SOLN
INTRAMUSCULAR | Status: AC
  Filled 2015-04-02: qty 30

## 2015-04-02 SURGICAL SUPPLY — 21 items
ABLATOR ENDOMETRIAL BIPOLAR (ABLATOR) IMPLANT
CANISTER SUCT 3000ML (MISCELLANEOUS) ×3 IMPLANT
CATH ROBINSON RED A/P 16FR (CATHETERS) ×3 IMPLANT
CLOTH BEACON ORANGE TIMEOUT ST (SAFETY) ×3 IMPLANT
CONTAINER PREFILL 10% NBF 60ML (FORM) ×6 IMPLANT
CORD ACTIVE DISPOSABLE (ELECTRODE)
CORD ELECTRO ACTIVE DISP (ELECTRODE) IMPLANT
ELECT LOOP GYNE PRO 24FR (CUTTING LOOP)
ELECT REM PT RETURN 9FT ADLT (ELECTROSURGICAL) ×3
ELECTRODE LOOP GYNE PRO 24FR (CUTTING LOOP) IMPLANT
ELECTRODE REM PT RTRN 9FT ADLT (ELECTROSURGICAL) ×1 IMPLANT
GLOVE BIO SURGEON STRL SZ7.5 (GLOVE) ×3 IMPLANT
GOWN STRL REUS W/TWL LRG LVL3 (GOWN DISPOSABLE) ×9 IMPLANT
LOOP ANGLED CUTTING 22FR (CUTTING LOOP) IMPLANT
PACK VAGINAL MINOR WOMEN LF (CUSTOM PROCEDURE TRAY) ×3 IMPLANT
PAD OB MATERNITY 4.3X12.25 (PERSONAL CARE ITEMS) ×3 IMPLANT
SYR TB 1ML 25GX5/8 (SYRINGE) ×3 IMPLANT
TOWEL OR 17X24 6PK STRL BLUE (TOWEL DISPOSABLE) ×6 IMPLANT
TUBING AQUILEX INFLOW (TUBING) ×3 IMPLANT
TUBING AQUILEX OUTFLOW (TUBING) ×3 IMPLANT
WATER STERILE IRR 1000ML POUR (IV SOLUTION) ×3 IMPLANT

## 2015-04-02 NOTE — Progress Notes (Signed)
Patient ID: Tracy Henry, female   DOB: Dec 26, 1975, 39 y.o.   MRN: [id number] Patient seen and examined. Consent witnessed and signed. No changes noted. Update completed.

## 2015-04-02 NOTE — H&P (Signed)
Tracy Henry is an 39 y.o. female. For Diag HS for AUB and ? Structural lesion on sono hys  Pertinent Gynecological History: Menses: flow is moderate Bleeding: intermenstrual bleeding Contraception: none DES exposure: denies Blood transfusions: none Sexually transmitted diseases: no past history Previous GYN Procedures: na  Last mammogram: normal Date: 2016 Last pap: normal Date: 2016 OB History: G0, P0   Menstrual History: Menarche age: 89  Patient's last menstrual period was 03/06/2015 (approximate).    Past Medical History  Diagnosis Date  . Hyperlipidemia     diet controlled, no meds  . Uterine polyp     Removed in 2010  . GERD (gastroesophageal reflux disease)     occasional, diet controlled  . Anemia     Past Surgical History  Procedure Laterality Date  . Cholecystectomy  around 2009  . Tonsillectomy and adenoidectomy  1984 or 1985  . Dilation and curettage of uterus      Family History  Problem Relation Age of Onset  . Hypertension Father   . Cancer Paternal Grandmother     Ovarian cancer, breast?  . Hypertension Mother   . Breast cancer Sister 36    Social History:  reports that she has never smoked. She has never used smokeless tobacco. She reports that she does not drink alcohol or use illicit drugs.  Allergies: No Known Allergies  Prescriptions prior to admission  Medication Sig Dispense Refill Last Dose  . OVER THE COUNTER MEDICATION Take 1 tablet by mouth daily. Patient takes over the counter Iron Supplement       Review of Systems  Constitutional: Negative.   All other systems reviewed and are negative.   Blood pressure 119/85, pulse 85, temperature 97.7 F (36.5 C), temperature source Oral, resp. rate 20, height 5\' 1"  (1.549 m), weight 114.306 kg (252 lb), last menstrual period 03/06/2015, SpO2 97 %. Physical Exam  Constitutional: She is oriented to person, place, and time. She appears well-developed and well-nourished.  HENT:  Head:  Normocephalic and atraumatic.  Neck: Neck supple.  Cardiovascular: Normal rate and regular rhythm.   Respiratory: Effort normal and breath sounds normal.  GI: Soft. Bowel sounds are normal.  Genitourinary: Vagina normal and uterus normal.  Musculoskeletal: Normal range of motion.  Neurological: She is alert and oriented to person, place, and time.  Skin: Skin is warm and dry.  Psychiatric: She has a normal mood and affect.    Results for orders placed or performed during the hospital encounter of 04/02/15 (from the past 24 hour(s))  CBC     Status: None   Collection Time: 04/02/15 12:00 PM  Result Value Ref Range   WBC 8.6 4.0 - 10.5 K/uL   RBC 4.88 3.87 - 5.11 MIL/uL   Hemoglobin 13.0 12.0 - 15.0 g/dL   HCT 39.7 36.0 - 46.0 %   MCV 81.4 78.0 - 100.0 fL   MCH 26.6 26.0 - 34.0 pg   MCHC 32.7 30.0 - 36.0 g/dL   RDW 14.7 11.5 - 15.5 %   Platelets 305 150 - 400 K/uL  hCG, serum, qualitative     Status: None   Collection Time: 04/02/15 12:00 PM  Result Value Ref Range   Preg, Serum NEGATIVE NEGATIVE    No results found.  Assessment/Plan: AUB - ? Polyp DIag HS with resection Consent done. Risks vs benefits discussed. Aware of surgical complications. Consent done.  Denae Zulueta J 04/02/2015, 12:37 PM

## 2015-04-02 NOTE — Anesthesia Preprocedure Evaluation (Signed)
Anesthesia Evaluation  Patient identified by MRN, date of birth, ID band  Reviewed: Allergy & Precautions, H&P , NPO status , Patient's Chart, lab work & pertinent test results  Airway Mallampati: II  TM Distance: >3 FB Neck ROM: full    Dental no notable dental hx. (+) Teeth Intact   Pulmonary neg pulmonary ROS,    Pulmonary exam normal       Cardiovascular negative cardio ROS Normal cardiovascular exam    Neuro/Psych negative neurological ROS  negative psych ROS   GI/Hepatic Neg liver ROS, GERD-  ,  Endo/Other  Morbid obesity  Renal/GU negative Renal ROS     Musculoskeletal   Abdominal (+) + obese,   Peds  Hematology negative hematology ROS (+)   Anesthesia Other Findings   Reproductive/Obstetrics negative OB ROS                             Anesthesia Physical Anesthesia Plan  ASA: III  Anesthesia Plan: General   Post-op Pain Management:    Induction: Intravenous  Airway Management Planned: LMA  Additional Equipment:   Intra-op Plan:   Post-operative Plan:   Informed Consent: I have reviewed the patients History and Physical, chart, labs and discussed the procedure including the risks, benefits and alternatives for the proposed anesthesia with the patient or authorized representative who has indicated his/her understanding and acceptance.     Plan Discussed with: CRNA and Surgeon  Anesthesia Plan Comments:         Anesthesia Quick Evaluation

## 2015-04-02 NOTE — Transfer of Care (Signed)
Immediate Anesthesia Transfer of Care Note  Patient: Tracy Henry  Procedure(s) Performed: Procedure(s): DILATATION & CURETTAGE/HYSTEROSCOPY WITH RESECTOCOPE (N/A)  Patient Location: PACU  Anesthesia Type:General  Level of Consciousness: awake, alert  and oriented  Airway & Oxygen Therapy: Patient Spontanous Breathing and Patient connected to nasal cannula oxygen  Post-op Assessment: Report given to RN and Post -op Vital signs reviewed and stable  Post vital signs: Reviewed and stable  Last Vitals:  Filed Vitals:   04/02/15 1350  BP: 141/90  Pulse: 84  Temp: 36.5 C  Resp: 16    Complications: No apparent anesthesia complications

## 2015-04-02 NOTE — Op Note (Signed)
04/02/2015  1:43 PM  PATIENT:  Tracy Henry  39 y.o. female  PRE-OPERATIVE DIAGNOSIS:  Abnormal Uterine Bleeding, Endometrial Polyp  POST-OPERATIVE DIAGNOSIS:  Abnormal uteerine bleeding, endometrial polyp  PROCEDURE:  Procedure(s): DILATATION & CURETTAGE HYSTEROSCOPY WITH RESECTOCOPE  SURGEON:  Surgeon(s): Brien Few, MD  ASSISTANTS: none   ANESTHESIA:   general  ESTIMATED BLOOD LOSS: minimal, fluid deficit 150cc  DRAINS: none   LOCAL MEDICATIONS USED:  NONE  SPECIMEN:  Source of Specimen:  endometrial polyps and EMC  DISPOSITION OF SPECIMEN:  PATHOLOGY  COUNTS:  YES  DICTATION #: [id number]  PLAN OF CARE: dc home  PATIENT DISPOSITION:  PACU - hemodynamically stable.

## 2015-04-02 NOTE — Anesthesia Postprocedure Evaluation (Signed)
Anesthesia Post Note  Patient: Tracy Henry  Procedure(s) Performed: Procedure(s) (LRB): DILATATION & CURETTAGE/HYSTEROSCOPY WITH RESECTOCOPE (N/A)  Anesthesia type: General  Patient location: PACU  Post pain: Pain level controlled  Post assessment: Post-op Vital signs reviewed  Last Vitals:  Filed Vitals:   04/02/15 1525  BP: 130/86  Pulse: 57  Temp: 36.5 C  Resp: 16    Post vital signs: Reviewed  Level of consciousness: sedated  Complications: No apparent anesthesia complications

## 2015-04-02 NOTE — Anesthesia Procedure Notes (Signed)
Procedure Name: LMA Insertion Date/Time: 04/02/2015 1:09 PM Performed by: Vernice Jefferson Pre-anesthesia Checklist: Patient identified, Emergency Drugs available, Suction available, Patient being monitored and Timeout performed Patient Re-evaluated:Patient Re-evaluated prior to inductionOxygen Delivery Method: Circle system utilized Preoxygenation: Pre-oxygenation with 100% oxygen Intubation Type: IV induction LMA: LMA inserted LMA Size: 4.0 Grade View: Grade II Number of attempts: 1 Tube secured with: Tape Dental Injury: Teeth and Oropharynx as per pre-operative assessment

## 2015-04-02 NOTE — Op Note (Signed)
NAMEEARA, BURRUEL NO.:  [id number]  MEDICAL RECORD NO.:  [id number]  LOCATION:  [facility]                          FACILITY:  [facility]  PHYSICIAN:  Lovenia Kim, M.D.DATE OF BIRTH:  Dec 16, 1975  DATE OF PROCEDURE: DATE OF DISCHARGE:  04/02/2015                              OPERATIVE REPORT   PREOPERATIVE DIAGNOSIS:  Abnormal uterine bleeding with questionable structural lesion.  POSTOPERATIVE DIAGNOSIS:  Multiple endometrial polyps.  PROCEDURE:  Diagnostic hysteroscopy, D and C, resectoscopic polypectomy.  SURGEON:  Lovenia Kim, M.D.  ASSISTANT:  None.  ANESTHESIA:  General.  ESTIMATED BLOOD LOSS:  Less than 50 mL.  FLUID DEFICIT:  150 mL.  COMPLICATIONS:  None,  DRAINS:  None.  COUNTS:  Correct.  DISPOSITION:  The patient to recovery room in good condition.  BRIEF OPERATIVE NOTE:  After being apprised of risks of anesthesia, infection, bleeding, injury to surrounding organs, possible need for repair, delayed versus immediate complications to include bowel and bladder injury, possible need for repair, the patient was brought to the operating room where she was administered general anesthetic without complications, she was prepped and draped in usual sterile fashion. Catheterized until the bladder was empty.  Exam under anesthesia revealed a bulky anteflexed uterus and no adnexal masses.  At this time, a dilute Pitressin solution placed at 3 and 9 o'clock at the cervicovaginal junction at 20 mL total.  Cervix easily dilated up to a #29 Pratt dilator.  Hysteroscope placed.  Visualization reveals a midline polypoid mass on the anterior wall and along the posterior wall. Areas of thickening along the lateral walls were noted.  The 2 polypoid areas were resected using right-angle loop without difficulty.  Sharp curettage in a gentle 4 quadrant method was performed for expression of further amounts of tissue.  At the end of the procedure,  revisualization reveals an empty intrauterine cavity with normal bilateral tubal ostia.  Otherwise, noted intracavitary lesions.  At this time, procedure was terminated.  Fluid deficit of 150 mL noted.  The patient tolerated the procedure and was transferred to recovery in good condition.     Lovenia Kim, M.D.     RJT/MEDQ  D:  04/02/2015  T:  04/02/2015  Job:  [id number]

## 2015-04-03 ENCOUNTER — Encounter (HOSPITAL_COMMUNITY): Payer: Self-pay | Admitting: Obstetrics and Gynecology

## 2015-04-15 ENCOUNTER — Ambulatory Visit (INDEPENDENT_AMBULATORY_CARE_PROVIDER_SITE_OTHER): Payer: 59 | Admitting: Primary Care

## 2015-04-15 ENCOUNTER — Encounter: Payer: Self-pay | Admitting: Primary Care

## 2015-04-15 VITALS — BP 126/82 | HR 76 | Temp 98.4°F | Ht 61.0 in | Wt 256.8 lb

## 2015-04-15 DIAGNOSIS — R7303 Prediabetes: Secondary | ICD-10-CM

## 2015-04-15 DIAGNOSIS — E119 Type 2 diabetes mellitus without complications: Secondary | ICD-10-CM | POA: Insufficient documentation

## 2015-04-15 DIAGNOSIS — E669 Obesity, unspecified: Secondary | ICD-10-CM | POA: Diagnosis not present

## 2015-04-15 DIAGNOSIS — E1165 Type 2 diabetes mellitus with hyperglycemia: Secondary | ICD-10-CM | POA: Insufficient documentation

## 2015-04-15 DIAGNOSIS — E785 Hyperlipidemia, unspecified: Secondary | ICD-10-CM | POA: Diagnosis not present

## 2015-04-15 DIAGNOSIS — R7309 Other abnormal glucose: Secondary | ICD-10-CM

## 2015-04-15 NOTE — Progress Notes (Signed)
Pre visit review using our clinic review tool, if applicable. No additional management support is needed unless otherwise documented below in the visit note. 

## 2015-04-15 NOTE — Assessment & Plan Note (Signed)
Recently joined [employer] and plans on joining a gym soon. Discussed the importance of weight loss and her co-morbidities.

## 2015-04-15 NOTE — Assessment & Plan Note (Signed)
Last A1C was in April at 6.0. She is due again today but endorses having wellness labs at her occupation. She is to fax lab results back to [place] in mid August. Discussed healthy diet and exercise.  Follow up in 4 months.
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
## 2015-04-15 NOTE — Patient Instructions (Signed)
Please fax your wellness lab results to my office at: [phone].  It is important that you improve your diet. Please limit carbohydrates in the form of white bread, rice, pasta, cakes, cookies, sugary drinks, etc. Increase your consumption of fresh fruits and vegetables. Be sure to drink plenty of water daily.  Work to start exercising. You need about 1 hour of moderate intensity exercise 5 days a week.  Follow up in 4 months for re-evaluation of weight loss efforts.  It was nice to see you!

## 2015-04-15 NOTE — Assessment & Plan Note (Signed)
Due for re-eval today. She is to have labs for same in 2 weeks and will fax [place] her results. Discussed the importance of healthy diet and exercise. She recently joined [employer] and is motivated.

## 2015-05-02 ENCOUNTER — Telehealth: Payer: Self-pay | Admitting: Primary Care

## 2015-05-02 NOTE — Telephone Encounter (Signed)
Will you remind Tracy Henry to fax the results of her A1C and lipid panel that she had drawn recently? Thanks.

## 2015-05-02 NOTE — Telephone Encounter (Signed)
Called and asked patient about the results of her A1C and lipid panel. Patient stated she will fax when it is available.

## 2015-05-13 ENCOUNTER — Telehealth: Payer: Self-pay | Admitting: Primary Care

## 2015-05-13 NOTE — Telephone Encounter (Signed)
I left a message for the patient to return my call.

## 2015-05-13 NOTE — Telephone Encounter (Signed)
Please notify Tracy Henry that I received her lab results from [facility]. She is still borderline diabetic and her cholesterol is now worse. She must work hard to improve her diet and exercise as discussed during our last visit. We will recheck her cholesterol and diabetes tests in December as scheduled. She is to come fasting to that appointment. Thanks.

## 2015-05-14 NOTE — Telephone Encounter (Signed)
Pt returned call [phone]

## 2015-05-14 NOTE — Telephone Encounter (Signed)
Called and notified patient of Tracy Henry's comments. Patient verbalized understanding.  

## 2015-08-15 ENCOUNTER — Ambulatory Visit (INDEPENDENT_AMBULATORY_CARE_PROVIDER_SITE_OTHER): Payer: 59 | Admitting: Primary Care

## 2015-08-15 ENCOUNTER — Encounter: Payer: Self-pay | Admitting: Primary Care

## 2015-08-15 VITALS — BP 124/80 | HR 77 | Temp 98.4°F | Ht 61.0 in | Wt 251.1 lb

## 2015-08-15 DIAGNOSIS — R7303 Prediabetes: Secondary | ICD-10-CM

## 2015-08-15 DIAGNOSIS — E785 Hyperlipidemia, unspecified: Secondary | ICD-10-CM

## 2015-08-15 NOTE — Assessment & Plan Note (Signed)
Lipid panel in August with TC of 221, LDL 173, HDL 20. She has joined [employer] but has not been eating well. She is not exercising. Lipid panel due today and is pending. If elevated will initiate statin as she doesn't seem motivated to lose weight.

## 2015-08-15 NOTE — Patient Instructions (Addendum)
Complete lab work prior to leaving today. I will notify you of your results.  It is important that you improve your diet. Please limit carbohydrates in the form of white bread, rice, pasta, crackers, sweets, etc. Increase your consumption of fresh fruits and vegetables.  You need to consume about 2 liters of water daily.  Start exercising. You should be getting 1 hour of moderate intensity exercise 5 days weekly.  Follow up in May 2017 for physical.  It was a pleasure to see you today!

## 2015-08-15 NOTE — Progress Notes (Signed)
Subjective:    Patient ID: Tracy Henry, female    DOB: [birth year], 39 y.o.   MRN: [id number]  HPI  Tracy Henry is a 39 year old female who presents today for follow up.  1) Borderline Diabetes: A1C of 5.9 in August 2016. She was excited to join weight watchers last visit and was going to start improving her diet and exercise. A1C due today.  2) Hyperlipidemia: TC of 221, LDL 173, HDL 20 in August 2016. Last visit she discussed that she was planning on joining weight watchers. She's joined and she endorses a fair diet.  Her diet currently consists of: Breakfast: Ego waffles without syrup, some coffee Lunch: Left overs Dinner: Chicken (baked), vegetables, some pasta Snacks: Occasionally peanut butter crackers. Desserts: Occasionally Beverages: Water  Exercise: She is not routinely exercising.   Wt Readings from Last 3 Encounters:  08/15/15 251 lb 1.9 oz (113.907 kg)  04/15/15 256 lb 12.8 oz (116.484 kg)  04/02/15 252 lb (114.306 kg)     Review of Systems  Eyes: Negative for visual disturbance.  Respiratory: Negative for shortness of breath.   Cardiovascular: Negative for chest pain.  Neurological: Negative for dizziness and numbness.       Past Medical History  Diagnosis Date  . Hyperlipidemia     diet controlled, no meds  . Uterine polyp     Removed in 2010  . GERD (gastroesophageal reflux disease)     occasional, diet controlled  . Anemia     Social History   Social History  . Marital Status: Married    Spouse Name: N/A  . Number of Children: N/A  . Years of Education: N/A   Occupational History  . Not on file.   Social History Main Topics  . Smoking status: Never Smoker   . Smokeless tobacco: Never Used  . Alcohol Use: No  . Drug Use: No  . Sexual Activity: Yes    Birth Control/ Protection: None   Other Topics Concern  . Not on file   Social History Narrative   Married for 17 years    No children   Work for Tracy Henry for 17 years   Enjoys  reading   From [place]    Past Surgical History  Procedure Laterality Date  . Cholecystectomy  around 2009  . Tonsillectomy and adenoidectomy  1984 or 1985  . Dilation and curettage of uterus    . Dilatation & currettage/hysteroscopy with resectocope N/A 04/02/2015    Procedure: [place];  Surgeon: Brien Few, MD;  Location: [facility];  Service: Gynecology;  Laterality: N/A;    Family History  Problem Relation Age of Onset  . Hypertension Father   . Cancer Paternal Grandmother     Ovarian cancer, breast?  . Hypertension Mother   . Breast cancer Sister 37    No Known Allergies  Current Outpatient Prescriptions on File Prior to Visit  Medication Sig Dispense Refill  . OVER THE COUNTER MEDICATION Take 1 tablet by mouth daily. Patient takes over the counter Iron Supplement     No current facility-administered medications on file prior to visit.    BP 124/80 mmHg  Pulse 77  Temp(Src) 98.4 F (36.9 C) (Oral)  Ht 5\' 1"  (1.549 m)  Wt 251 lb 1.9 oz (113.907 kg)  BMI 47.47 kg/m2  SpO2 94%  LMP 07/14/2015    Objective:   Physical Exam  Constitutional: She appears well-nourished.  Cardiovascular: Normal rate and regular rhythm.  Pulmonary/Chest: Effort normal and breath sounds normal.  Skin: Skin is warm and dry.  Psychiatric: She has a normal mood and affect.          Assessment & Plan:

## 2015-08-15 NOTE — Assessment & Plan Note (Signed)
A1C of 5.9 in August 2016. A1C due today and is pending. Discussed importance of healthy diet and exercise.

## 2015-08-15 NOTE — Progress Notes (Signed)
Pre visit review using our clinic review tool, if applicable. No additional management support is needed unless otherwise documented below in the visit note. 

## 2015-08-16 LAB — LIPID PANEL
CHOL/HDL RATIO: 5.5 ratio — AB (ref 0.0–4.4)
Cholesterol, Total: 191 mg/dL (ref 100–199)
HDL: 35 mg/dL — ABNORMAL LOW (ref >39)
LDL Calculated: 131 mg/dL — ABNORMAL HIGH (ref 0–99)
Triglycerides: 123 mg/dL (ref 0–149)
VLDL Cholesterol Cal: 25 mg/dL (ref 5–40)

## 2015-08-16 LAB — BASIC METABOLIC PANEL
BUN/Creatinine Ratio: 15 (ref 9–23)
BUN: 11 mg/dL (ref 6–24)
CALCIUM: 8.7 mg/dL (ref 8.7–10.2)
CO2: 20 mmol/L (ref 18–29)
Chloride: 103 mmol/L (ref 97–106)
Creatinine, Ser: 0.72 mg/dL (ref 0.57–1.00)
GFR calc Af Amer: 121 mL/min/{1.73_m2} (ref >59)
GFR calc non Af Amer: 105 mL/min/{1.73_m2} (ref >59)
GLUCOSE: 102 mg/dL — AB (ref 65–99)
Potassium: 4.4 mmol/L (ref 3.5–5.2)
Sodium: 138 mmol/L (ref 136–144)

## 2015-08-16 LAB — HEMOGLOBIN A1C
ESTIMATED AVERAGE GLUCOSE: 131 mg/dL
HEMOGLOBIN A1C: 6.2 % — AB (ref 4.8–5.6)

## 2016-01-14 ENCOUNTER — Other Ambulatory Visit: Payer: Self-pay | Admitting: Primary Care

## 2016-01-14 DIAGNOSIS — D509 Iron deficiency anemia, unspecified: Secondary | ICD-10-CM

## 2016-01-14 DIAGNOSIS — R7303 Prediabetes: Secondary | ICD-10-CM

## 2016-01-14 DIAGNOSIS — E785 Hyperlipidemia, unspecified: Secondary | ICD-10-CM

## 2016-01-21 ENCOUNTER — Other Ambulatory Visit (INDEPENDENT_AMBULATORY_CARE_PROVIDER_SITE_OTHER): Payer: 59

## 2016-01-21 DIAGNOSIS — R7303 Prediabetes: Secondary | ICD-10-CM

## 2016-01-21 DIAGNOSIS — D509 Iron deficiency anemia, unspecified: Secondary | ICD-10-CM | POA: Diagnosis not present

## 2016-01-21 DIAGNOSIS — E785 Hyperlipidemia, unspecified: Secondary | ICD-10-CM

## 2016-01-22 LAB — COMPREHENSIVE METABOLIC PANEL
ALT: 13 IU/L (ref 0–32)
AST: 17 IU/L (ref 0–40)
Albumin/Globulin Ratio: 1.1 — ABNORMAL LOW (ref 1.2–2.2)
Albumin: 3.7 g/dL (ref 3.5–5.5)
Alkaline Phosphatase: 77 IU/L (ref 39–117)
BUN/Creatinine Ratio: 23 (ref 9–23)
BUN: 14 mg/dL (ref 6–24)
Bilirubin Total: <0.2 mg/dL (ref 0.0–1.2)
CO2: 18 mmol/L (ref 18–29)
CREATININE: 0.62 mg/dL (ref 0.57–1.00)
Calcium: 8.9 mg/dL (ref 8.7–10.2)
Chloride: 102 mmol/L (ref 96–106)
GFR calc non Af Amer: 113 mL/min/{1.73_m2} (ref >59)
GFR, EST AFRICAN AMERICAN: 130 mL/min/{1.73_m2} (ref >59)
GLOBULIN, TOTAL: 3.3 g/dL (ref 1.5–4.5)
Glucose: 95 mg/dL (ref 65–99)
Potassium: 4.2 mmol/L (ref 3.5–5.2)
Sodium: 140 mmol/L (ref 134–144)
TOTAL PROTEIN: 7 g/dL (ref 6.0–8.5)

## 2016-01-22 LAB — IRON AND TIBC
IRON: 44 ug/dL (ref 27–159)
Iron Saturation: 14 % — ABNORMAL LOW (ref 15–55)
Total Iron Binding Capacity: 320 ug/dL (ref 250–450)
UIBC: 276 ug/dL (ref 131–425)

## 2016-01-22 LAB — CBC WITH DIFFERENTIAL/PLATELET
BASOS: 0 %
Basophils Absolute: 0 10*3/uL (ref 0.0–0.2)
EOS (ABSOLUTE): 0.2 10*3/uL (ref 0.0–0.4)
Eos: 2 %
Hematocrit: 38.4 % (ref 34.0–46.6)
Hemoglobin: 13.1 g/dL (ref 11.1–15.9)
IMMATURE GRANULOCYTES: 0 %
Immature Grans (Abs): 0 10*3/uL (ref 0.0–0.1)
Lymphocytes Absolute: 2.8 10*3/uL (ref 0.7–3.1)
Lymphs: 27 %
MCH: 27.5 pg (ref 26.6–33.0)
MCHC: 34.1 g/dL (ref 31.5–35.7)
MCV: 81 fL (ref 79–97)
Monocytes Absolute: 0.9 10*3/uL (ref 0.1–0.9)
Monocytes: 9 %
NEUTROS PCT: 62 %
Neutrophils Absolute: 6.2 10*3/uL (ref 1.4–7.0)
PLATELETS: 328 10*3/uL (ref 150–379)
RBC: 4.76 x10E6/uL (ref 3.77–5.28)
RDW: 13.7 % (ref 12.3–15.4)
WBC: 10.1 10*3/uL (ref 3.4–10.8)

## 2016-01-22 LAB — LIPID PANEL
CHOLESTEROL TOTAL: 176 mg/dL (ref 100–199)
Chol/HDL Ratio: 5.2 ratio units — ABNORMAL HIGH (ref 0.0–4.4)
HDL: 34 mg/dL — AB (ref >39)
LDL Calculated: 109 mg/dL — ABNORMAL HIGH (ref 0–99)
Triglycerides: 167 mg/dL — ABNORMAL HIGH (ref 0–149)
VLDL CHOLESTEROL CAL: 33 mg/dL (ref 5–40)

## 2016-01-22 LAB — HEMOGLOBIN A1C
Est. average glucose Bld gHb Est-mCnc: 126 mg/dL
Hgb A1c MFr Bld: 6 % — ABNORMAL HIGH (ref 4.8–5.6)

## 2016-01-28 ENCOUNTER — Encounter: Payer: Self-pay | Admitting: Primary Care

## 2016-01-28 ENCOUNTER — Ambulatory Visit (INDEPENDENT_AMBULATORY_CARE_PROVIDER_SITE_OTHER): Payer: 59 | Admitting: Primary Care

## 2016-01-28 VITALS — BP 124/82 | HR 78 | Temp 98.1°F | Ht 61.0 in | Wt 255.4 lb

## 2016-01-28 DIAGNOSIS — E785 Hyperlipidemia, unspecified: Secondary | ICD-10-CM | POA: Diagnosis not present

## 2016-01-28 DIAGNOSIS — Z0000 Encounter for general adult medical examination without abnormal findings: Secondary | ICD-10-CM | POA: Diagnosis not present

## 2016-01-28 DIAGNOSIS — E669 Obesity, unspecified: Secondary | ICD-10-CM

## 2016-01-28 DIAGNOSIS — R7303 Prediabetes: Secondary | ICD-10-CM

## 2016-01-28 NOTE — Patient Instructions (Signed)
Your sugars have improved, continue to work on a low calorie, low carbohydrate diet.   Start exercising. You should be getting 1 hour of moderate intensity exercise 5 days weekly.  Ensure you are consuming 64 ounces of water daily.  Continue your iron supplement.  Your triglycerides are slightly above goal. Exercise will help to reduce these levels. Also, Fish Oil 1000 mg once daily will also decrease levels.  Follow up in 1 year for repeat physical or sooner if needed.  It was a pleasure to see you today!

## 2016-01-28 NOTE — Assessment & Plan Note (Signed)
Fair diet, however weight gain of 4 pounds since last visit. Highly encouraged exercise, increase vegetables. Will continue to monitor.

## 2016-01-28 NOTE — Assessment & Plan Note (Signed)
Overall stable with exception of Trigs which are slightly above goal. Discussed importance of exercise. Will add in low dose Fish Oil. Will continue to monitor. Repeat in 1 year.

## 2016-01-28 NOTE — Assessment & Plan Note (Signed)
Td UTD per patient. PAP and mammogram per GYN. Discussed the importance of a healthy diet and regular exercise in order for weight loss and to reduce risk of other medical diseases. Labs with slight elevation in trigs, improvement in A1C. Exam unremarkable. Follow up in 1 year for repeat physical.

## 2016-01-28 NOTE — Progress Notes (Signed)
Pre visit review using our clinic review tool, if applicable. No additional management support is needed unless otherwise documented below in the visit note. 

## 2016-01-28 NOTE — Progress Notes (Signed)
Subjective:    Patient ID: Tracy Henry, female    DOB: 1976-08-21, 40 y.o.   MRN: [id number]  HPI  Tracy Henry is a 40 year old female who presents today for complete physical.  Immunizations: -Tetanus: Updated per GYN.  -Influenza: Did not receive last season.   Diet: She endorses a healthy diet. Breakfast: Fruit Lunch: Ground [place], veggies, salad, chicken Dinner: Same as lunch Snacks: Almonds Desserts: None Beverages: Water  Exercise: She does not currently exercise. Eye exam: Due May 2017. Dental exam: Completed 2 years ago Pap Smear: See's GYN. Mammogram: See's GYN.   Review of Systems  Constitutional: Negative for unexpected weight change.  HENT: Negative for rhinorrhea.   Respiratory: Negative for cough and shortness of breath.   Cardiovascular: Negative for chest pain.  Gastrointestinal: Negative for diarrhea and constipation.  Genitourinary: Negative for difficulty urinating and menstrual problem.       Regular periods  Musculoskeletal: Negative for myalgias and arthralgias.  Skin: Negative for rash.  Allergic/Immunologic: Negative for environmental allergies.  Neurological: Negative for dizziness, numbness and headaches.  Psychiatric/Behavioral:       Denies concerns for anxiety or depression       Past Medical History  Diagnosis Date  . Hyperlipidemia     diet controlled, no meds  . Uterine polyp     Removed in 2010  . GERD (gastroesophageal reflux disease)     occasional, diet controlled  . Anemia      Social History   Social History  . Marital Status: Married    Spouse Name: N/A  . Number of Children: N/A  . Years of Education: N/A   Occupational History  . Not on file.   Social History Main Topics  . Smoking status: Never Smoker   . Smokeless tobacco: Never Used  . Alcohol Use: No  . Drug Use: No  . Sexual Activity: Yes    Birth Control/ Protection: None   Other Topics Concern  . Not on file   Social History Narrative   Married for 17 years    No children   Work for [employer] for 17 years   Enjoys reading   From [place]    Past Surgical History  Procedure Laterality Date  . Cholecystectomy  around 2009  . Tonsillectomy and adenoidectomy  1984 or 1985  . Dilation and curettage of uterus    . Dilatation & currettage/hysteroscopy with resectocope N/A 04/02/2015    Procedure: [place];  Surgeon: Brien Few, MD;  Location: [facility];  Service: Gynecology;  Laterality: N/A;    Family History  Problem Relation Age of Onset  . Hypertension Father   . Cancer Paternal Grandmother     Ovarian cancer, breast?  . Hypertension Mother   . Breast cancer Sister 62    No Known Allergies  No current outpatient prescriptions on file prior to visit.   No current facility-administered medications on file prior to visit.    BP 124/82 mmHg  Pulse 78  Temp(Src) 98.1 F (36.7 C) (Oral)  Ht 5\' 1"  (1.549 m)  Wt 255 lb 6.4 oz (115.849 kg)  BMI 48.28 kg/m2  SpO2 97%  LMP 01/02/2016    Objective:   Physical Exam  Constitutional: She is oriented to person, place, and time. She appears well-nourished.  HENT:  Right Ear: Tympanic membrane and ear canal normal.  Left Ear: Tympanic membrane and ear canal normal.  Nose: Nose normal.  Mouth/Throat: Oropharynx is clear  and moist.  Eyes: Conjunctivae and EOM are normal. Pupils are equal, round, and reactive to light.  Neck: Neck supple. No thyromegaly present.  Cardiovascular: Normal rate and regular rhythm.   No murmur heard. Pulmonary/Chest: Effort normal and breath sounds normal. She has no rales.  Abdominal: Soft. Bowel sounds are normal. There is no tenderness.  Musculoskeletal: Normal range of motion.  Lymphadenopathy:    She has no cervical adenopathy.  Neurological: She is alert and oriented to person, place, and time. She has normal reflexes. No cranial nerve deficit.  Skin: Skin is warm and dry. No rash  noted.  Psychiatric: She has a normal mood and affect.          Assessment & Plan:

## 2016-01-28 NOTE — Assessment & Plan Note (Signed)
Reduction of A1C from 6.2 to 6.0 on recent labs. Improvement in diet, commended her on this. Encouraged exercise. Will continue to monitor, repeat A1C in 1 year.

## 2017-01-19 ENCOUNTER — Other Ambulatory Visit: Payer: Self-pay | Admitting: Primary Care

## 2017-01-19 DIAGNOSIS — D509 Iron deficiency anemia, unspecified: Secondary | ICD-10-CM

## 2017-01-19 DIAGNOSIS — R7303 Prediabetes: Secondary | ICD-10-CM

## 2017-01-19 DIAGNOSIS — E785 Hyperlipidemia, unspecified: Secondary | ICD-10-CM

## 2017-01-27 ENCOUNTER — Other Ambulatory Visit (INDEPENDENT_AMBULATORY_CARE_PROVIDER_SITE_OTHER): Payer: 59

## 2017-01-27 DIAGNOSIS — R7303 Prediabetes: Secondary | ICD-10-CM | POA: Diagnosis not present

## 2017-01-27 DIAGNOSIS — D509 Iron deficiency anemia, unspecified: Secondary | ICD-10-CM

## 2017-01-27 DIAGNOSIS — E785 Hyperlipidemia, unspecified: Secondary | ICD-10-CM | POA: Diagnosis not present

## 2017-01-27 LAB — LIPID PANEL
CHOLESTEROL: 176 mg/dL (ref 0–200)
HDL: 31.9 mg/dL — ABNORMAL LOW (ref >39.00)
LDL Cholesterol: 122 mg/dL — ABNORMAL HIGH (ref 0–99)
NonHDL: 144.47
TRIGLYCERIDES: 111 mg/dL (ref 0.0–149.0)
Total CHOL/HDL Ratio: 6
VLDL: 22.2 mg/dL (ref 0.0–40.0)

## 2017-01-27 LAB — COMPREHENSIVE METABOLIC PANEL
ALBUMIN: 4.1 g/dL (ref 3.5–5.2)
ALK PHOS: 67 U/L (ref 39–117)
ALT: 14 U/L (ref 0–35)
AST: 13 U/L (ref 0–37)
BILIRUBIN TOTAL: 0.5 mg/dL (ref 0.2–1.2)
BUN: 16 mg/dL (ref 6–23)
CO2: 25 mEq/L (ref 19–32)
CREATININE: 0.72 mg/dL (ref 0.40–1.20)
Calcium: 9.5 mg/dL (ref 8.4–10.5)
Chloride: 104 mEq/L (ref 96–112)
GFR: 114.33 mL/min (ref >60.00)
Glucose, Bld: 113 mg/dL — ABNORMAL HIGH (ref 70–99)
Potassium: 3.8 mEq/L (ref 3.5–5.1)
Sodium: 136 mEq/L (ref 135–145)
TOTAL PROTEIN: 7.4 g/dL (ref 6.0–8.3)

## 2017-01-27 LAB — CBC
HEMATOCRIT: 39.7 % (ref 36.0–46.0)
HEMOGLOBIN: 12.9 g/dL (ref 12.0–15.0)
MCHC: 32.5 g/dL (ref 30.0–36.0)
MCV: 80.4 fl (ref 78.0–100.0)
PLATELETS: 312 10*3/uL (ref 150.0–400.0)
RBC: 4.93 Mil/uL (ref 3.87–5.11)
RDW: 14.7 % (ref 11.5–15.5)
WBC: 10.2 10*3/uL (ref 4.0–10.5)

## 2017-01-27 LAB — HEMOGLOBIN A1C: Hgb A1c MFr Bld: 5.9 % (ref 4.6–6.5)

## 2017-01-29 ENCOUNTER — Encounter: Payer: 59 | Admitting: Primary Care

## 2017-02-02 ENCOUNTER — Ambulatory Visit (INDEPENDENT_AMBULATORY_CARE_PROVIDER_SITE_OTHER): Payer: 59 | Admitting: Primary Care

## 2017-02-02 ENCOUNTER — Encounter: Payer: Self-pay | Admitting: Primary Care

## 2017-02-02 DIAGNOSIS — Z6841 Body Mass Index (BMI) 40.0 and over, adult: Secondary | ICD-10-CM | POA: Diagnosis not present

## 2017-02-02 DIAGNOSIS — IMO0001 Reserved for inherently not codable concepts without codable children: Secondary | ICD-10-CM

## 2017-02-02 DIAGNOSIS — E785 Hyperlipidemia, unspecified: Secondary | ICD-10-CM | POA: Diagnosis not present

## 2017-02-02 DIAGNOSIS — Z0000 Encounter for general adult medical examination without abnormal findings: Secondary | ICD-10-CM | POA: Diagnosis not present

## 2017-02-02 DIAGNOSIS — R7303 Prediabetes: Secondary | ICD-10-CM

## 2017-02-02 DIAGNOSIS — E669 Obesity, unspecified: Secondary | ICD-10-CM

## 2017-02-02 NOTE — Progress Notes (Signed)
Subjective:    Patient ID: CATALIA MASSETT, female    DOB: 1976-09-01, 41 y.o.   MRN: [id number]  HPI  Ms. Dix is a 41 year old female who presents today for complete physical.  Immunizations: -Tetanus: UTD, completed at GYN. -Influenza: Did not complete last season.   Diet: She endorses a fair diet. She started eating a keto diet 1 year ago. Breakfast: Eggs, bacon, low carb pancakes Lunch: Vegetables, meat Dinner: Vegetables, meat Snacks: None Desserts: None Beverages: Water  Exercise: She does not exercise. Eye exam: Completed in 2017. Dental exam: Hasn't been recently. Pap Smear: Follows with GYN.  Mammogram: Follows with GYN.   Review of Systems  Constitutional: Negative for unexpected weight change.  HENT: Negative for rhinorrhea.   Respiratory: Negative for cough and shortness of breath.   Cardiovascular: Negative for chest pain.  Gastrointestinal: Negative for constipation and diarrhea.  Genitourinary: Negative for difficulty urinating and menstrual problem.  Musculoskeletal: Negative for arthralgias and myalgias.  Skin: Negative for rash.  Allergic/Immunologic: Negative for environmental allergies.  Neurological: Negative for dizziness, numbness and headaches.  Psychiatric/Behavioral:       Denies concerns for anxiety or depression       Past Medical History:  Diagnosis Date  . Anemia   . GERD (gastroesophageal reflux disease)    occasional, diet controlled  . Hyperlipidemia    diet controlled, no meds  . Uterine polyp    Removed in 2010     Social History   Social History  . Marital status: Married    Spouse name: N/A  . Number of children: N/A  . Years of education: N/A   Occupational History  . Not on file.   Social History Main Topics  . Smoking status: Never Smoker  . Smokeless tobacco: Never Used  . Alcohol use No  . Drug use: No  . Sexual activity: Yes    Birth control/ protection: None   Other Topics Concern  . Not on file    Social History Narrative   Married for 17 years    No children   Work for [employer] for 17 years   Enjoys reading   From [place]    Past Surgical History:  Procedure Laterality Date  . CHOLECYSTECTOMY  around 2009  . DILATATION & CURRETTAGE/HYSTEROSCOPY WITH RESECTOCOPE N/A 04/02/2015   Procedure: DILATATION & CURETTAGE/HYSTEROSCOPY WITH RESECTOCOPE;  Surgeon: Brien Few, MD;  Location: [facility];  Service: Gynecology;  Laterality: N/A;  . DILATION AND CURETTAGE OF UTERUS    . TONSILLECTOMY AND ADENOIDECTOMY  1984 or 1985    Family History  Problem Relation Age of Onset  . Hypertension Father   . Cancer Paternal Grandmother        Ovarian cancer, breast?  . Hypertension Mother   . Breast cancer Sister 88    No Known Allergies  Current Outpatient Prescriptions on File Prior to Visit  Medication Sig Dispense Refill  . IRON PO Take 1 tablet by mouth daily.     No current facility-administered medications on file prior to visit.     BP 118/78   Pulse 87   Temp 97.8 F (36.6 C) (Oral)   Ht 5\' 1"  (1.549 m)   Wt 254 lb 12.8 oz (115.6 kg)   LMP 01/12/2017 (Within Days)   SpO2 97%   BMI 48.14 kg/m    Objective:   Physical Exam  Constitutional: She is oriented to person, place, and time. She appears well-nourished.  HENT:  Right Ear: Tympanic membrane and ear canal normal.  Left Ear: Tympanic membrane and ear canal normal.  Nose: Nose normal.  Mouth/Throat: Oropharynx is clear and moist.  Eyes: Conjunctivae and EOM are normal. Pupils are equal, round, and reactive to light.  Neck: Neck supple. No thyromegaly present.  Cardiovascular: Normal rate and regular rhythm.   No murmur heard. Pulmonary/Chest: Effort normal and breath sounds normal. She has no rales.  Abdominal: Soft. Bowel sounds are normal. There is no tenderness.  Musculoskeletal: Normal range of motion.  Lymphadenopathy:    She has no cervical adenopathy.  Neurological: She is alert and oriented  to person, place, and time. She has normal reflexes. No cranial nerve deficit.  Skin: Skin is warm and dry. No rash noted.  Psychiatric: She has a normal mood and affect.          Assessment & Plan:

## 2017-02-02 NOTE — Patient Instructions (Addendum)
Please message me with the date of your tetanus vaccination.  It's important to work on a healthy diet.  Increase consumption of fresh vegetables and fruits, whole grains, water.  Ensure you are drinking 64 ounces of water daily.  Start exercising. You should be getting 150 minutes of moderate intensity exercise weekly.  Schedule a lab only appointment in 6 months for recheck of your blood sugar level.  Follow up in 1 year for your annual exam or sooner if needed.  It was a pleasure to see you today!

## 2017-02-02 NOTE — Assessment & Plan Note (Signed)
Lipids stable, LDL borderline. Discussed to reduce meat products, increase vegetables, start exercising.

## 2017-02-02 NOTE — Assessment & Plan Note (Signed)
Discussed to increase vegetables, start exercising.

## 2017-02-02 NOTE — Assessment & Plan Note (Signed)
Tetanus UTD, she will message [place] with the date. Pap and mammogram UTD, following with GYN. Discussed the importance of a healthy diet and regular exercise in order for weight loss, and to reduce the risk of other medical diseases. Exam unremarkable. Labs stable. Follow up in 1 year.

## 2017-02-02 NOTE — Assessment & Plan Note (Signed)
A1C stable, still in the prediabetic range. Discussed to increase vegetables and exercise. Repeat A1C in 6 months.

## 2017-02-03 ENCOUNTER — Encounter: Payer: Self-pay | Admitting: Primary Care

## 2017-03-21 ENCOUNTER — Encounter: Payer: Self-pay | Admitting: Primary Care

## 2017-07-27 ENCOUNTER — Other Ambulatory Visit: Payer: Self-pay | Admitting: Primary Care

## 2017-07-27 DIAGNOSIS — R7303 Prediabetes: Secondary | ICD-10-CM

## 2017-07-27 DIAGNOSIS — E559 Vitamin D deficiency, unspecified: Secondary | ICD-10-CM

## 2017-08-03 ENCOUNTER — Other Ambulatory Visit: Payer: 59

## 2017-08-24 ENCOUNTER — Other Ambulatory Visit: Payer: 59

## 2018-02-03 ENCOUNTER — Encounter: Payer: Self-pay | Admitting: Primary Care

## 2018-02-03 ENCOUNTER — Ambulatory Visit (INDEPENDENT_AMBULATORY_CARE_PROVIDER_SITE_OTHER): Payer: Managed Care, Other (non HMO) | Admitting: Primary Care

## 2018-02-03 VITALS — BP 126/74 | HR 79 | Temp 98.1°F | Ht 61.0 in | Wt 268.0 lb

## 2018-02-03 DIAGNOSIS — Z0000 Encounter for general adult medical examination without abnormal findings: Secondary | ICD-10-CM

## 2018-02-03 DIAGNOSIS — Z6841 Body Mass Index (BMI) 40.0 and over, adult: Secondary | ICD-10-CM | POA: Diagnosis not present

## 2018-02-03 DIAGNOSIS — E785 Hyperlipidemia, unspecified: Secondary | ICD-10-CM | POA: Diagnosis not present

## 2018-02-03 DIAGNOSIS — K219 Gastro-esophageal reflux disease without esophagitis: Secondary | ICD-10-CM | POA: Diagnosis not present

## 2018-02-03 DIAGNOSIS — D649 Anemia, unspecified: Secondary | ICD-10-CM | POA: Diagnosis not present

## 2018-02-03 DIAGNOSIS — R7303 Prediabetes: Secondary | ICD-10-CM

## 2018-02-03 LAB — COMPREHENSIVE METABOLIC PANEL
ALT: 14 U/L (ref 0–35)
AST: 14 U/L (ref 0–37)
Albumin: 3.7 g/dL (ref 3.5–5.2)
Alkaline Phosphatase: 67 U/L (ref 39–117)
BUN: 16 mg/dL (ref 6–23)
CALCIUM: 9.3 mg/dL (ref 8.4–10.5)
CHLORIDE: 105 meq/L (ref 96–112)
CO2: 25 meq/L (ref 19–32)
Creatinine, Ser: 0.68 mg/dL (ref 0.40–1.20)
GFR: 121.53 mL/min (ref >60.00)
Glucose, Bld: 103 mg/dL — ABNORMAL HIGH (ref 70–99)
POTASSIUM: 4.2 meq/L (ref 3.5–5.1)
SODIUM: 137 meq/L (ref 135–145)
Total Bilirubin: 0.4 mg/dL (ref 0.2–1.2)
Total Protein: 7.1 g/dL (ref 6.0–8.3)

## 2018-02-03 LAB — CBC
HEMATOCRIT: 39.2 % (ref 36.0–46.0)
HEMOGLOBIN: 12.9 g/dL (ref 12.0–15.0)
MCHC: 33 g/dL (ref 30.0–36.0)
MCV: 80.7 fl (ref 78.0–100.0)
Platelets: 310 10*3/uL (ref 150.0–400.0)
RBC: 4.85 Mil/uL (ref 3.87–5.11)
RDW: 14.9 % (ref 11.5–15.5)
WBC: 9.3 10*3/uL (ref 4.0–10.5)

## 2018-02-03 LAB — LIPID PANEL
CHOL/HDL RATIO: 5
CHOLESTEROL: 172 mg/dL (ref 0–200)
HDL: 34.3 mg/dL — ABNORMAL LOW (ref >39.00)
LDL CALC: 115 mg/dL — AB (ref 0–99)
NonHDL: 138.16
TRIGLYCERIDES: 117 mg/dL (ref 0.0–149.0)
VLDL: 23.4 mg/dL (ref 0.0–40.0)

## 2018-02-03 LAB — HEMOGLOBIN A1C: Hgb A1c MFr Bld: 6.1 % (ref 4.6–6.5)

## 2018-02-03 LAB — VITAMIN D 25 HYDROXY (VIT D DEFICIENCY, FRACTURES): VITD: 12.9 ng/mL — ABNORMAL LOW (ref 30.00–100.00)

## 2018-02-03 NOTE — Progress Notes (Signed)
Subjective:    Patient ID: Tracy Henry, female    DOB: 08/17/1976, 41 y.o.   MRN: [id number]  HPI  Tracy Henry is a 42 year old female who presents today for complete physical.  Immunizations: -Tetanus: Completed in 2012 -Influenza: Did not receive last season   Diet: She endorses a healthy diet. Breakfast: Fruit, granola, cheese Lunch: Meat, vegetable, brown rice Dinner: Meat, vegetable, sweet potatoes Snacks: Trail Mix Desserts: 1-2 times weekly Beverages: Water  Exercise: She is not exercising  Eye exam: Completed in May 2019 Dental exam: No recent exam Pap Smear: UTD per patient, follows with GYN Mammogram: UTD per patient, follows with GYN   Review of Systems  Constitutional: Negative for unexpected weight change.  HENT: Negative for rhinorrhea.   Respiratory: Negative for cough and shortness of breath.   Cardiovascular: Negative for chest pain.  Gastrointestinal: Negative for constipation and diarrhea.  Genitourinary: Negative for difficulty urinating and menstrual problem.  Musculoskeletal: Negative for arthralgias and myalgias.  Skin: Negative for rash.  Allergic/Immunologic: Positive for environmental allergies.  Neurological: Negative for dizziness, numbness and headaches.  Psychiatric/Behavioral: The patient is not nervous/anxious.        Past Medical History:  Diagnosis Date  . Anemia   . GERD (gastroesophageal reflux disease)    occasional, diet controlled  . Hyperlipidemia    diet controlled, no meds  . Uterine polyp    Removed in 2010     Social History   Socioeconomic History  . Marital status: Married    Spouse name: Not on file  . Number of children: Not on file  . Years of education: Not on file  . Highest education level: Not on file  Occupational History  . Not on file  Social Needs  . Financial resource strain: Not on file  . Food insecurity:    Worry: Not on file    Inability: Not on file  . Transportation needs:    Medical:  Not on file    Non-medical: Not on file  Tobacco Use  . Smoking status: Never Smoker  . Smokeless tobacco: Never Used  Substance and Sexual Activity  . Alcohol use: No    Alcohol/week: 0.0 oz  . Drug use: No  . Sexual activity: Yes    Birth control/protection: None  Lifestyle  . Physical activity:    Days per week: Not on file    Minutes per session: Not on file  . Stress: Not on file  Relationships  . Social connections:    Talks on phone: Not on file    Gets together: Not on file    Attends religious service: Not on file    Active member of club or organization: Not on file    Attends meetings of clubs or organizations: Not on file    Relationship status: Not on file  . Intimate partner violence:    Fear of current or ex partner: Not on file    Emotionally abused: Not on file    Physically abused: Not on file    Forced sexual activity: Not on file  Other Topics Concern  . Not on file  Social History Narrative   Married for 17 years    No children   Work for [employer] for 17 years   Enjoys reading   From [place]    Past Surgical History:  Procedure Laterality Date  . CHOLECYSTECTOMY  around 2009  . DILATATION & CURRETTAGE/HYSTEROSCOPY WITH RESECTOCOPE N/A 04/02/2015  Procedure: [place];  Surgeon: Brien Few, MD;  Location: [facility];  Service: Gynecology;  Laterality: N/A;  . DILATION AND CURETTAGE OF UTERUS    . TONSILLECTOMY AND ADENOIDECTOMY  1984 or 1985    Family History  Problem Relation Age of Onset  . Hypertension Father   . Cancer Paternal Grandmother        Ovarian cancer, breast?  . Hypertension Mother   . Breast cancer Sister 83    No Known Allergies  Current Outpatient Medications on File Prior to Visit  Medication Sig Dispense Refill  . azelastine (ASTELIN) 0.1 % nasal spray Place 1 spray into both nostrils 2 (two) times daily. Use in each nostril as directed     No current  facility-administered medications on file prior to visit.     BP 126/74   Pulse 79   Temp 98.1 F (36.7 C) (Oral)   Ht 5\' 1"  (1.549 m)   Wt 268 lb (121.6 kg)   LMP 01/28/2018   SpO2 99%   BMI 50.64 kg/m    Objective:   Physical Exam  Constitutional: She is oriented to person, place, and time. She appears well-nourished.  HENT:  Mouth/Throat: No oropharyngeal exudate.  Eyes: Pupils are equal, round, and reactive to light. EOM are normal.  Neck: Neck supple. No thyromegaly present.  Cardiovascular: Normal rate and regular rhythm.  Respiratory: Effort normal and breath sounds normal.  GI: Soft. Bowel sounds are normal. There is no tenderness.  Musculoskeletal: Normal range of motion.  Neurological: She is alert and oriented to person, place, and time.  Skin: Skin is warm and dry.  Psychiatric: She has a normal mood and affect.           Assessment & Plan:

## 2018-02-03 NOTE — Assessment & Plan Note (Signed)
Repeat A1C pending today. Discussed the importance of a healthy diet and regular exercise in order for weight loss, and to reduce the risk of any potential medical problems.

## 2018-02-03 NOTE — Patient Instructions (Addendum)
Stop by the lab prior to leaving today. I will notify you of your results once received.   Start exercising. You should be getting 150 minutes of moderate intensity exercise weekly.  Increase consumption of vegetables, fruit, whole grains, lean protein, water.  Ensure you are consuming 64 ounces of water daily.  Consider trying Flonase (fluticasone) nasal spray and Zyrtec/Allegra/Claritin/Xyzal tablets for allergies.  Consider checking into [facility] or [facility].  Consider calorie counting through My Fitness Pal.  Follow up in 1 year for your annual exam or sooner if needed.  It was a pleasure to see you today!    Preventive Care 40-64 Years, Female Preventive care refers to lifestyle choices and visits with your health care provider that can promote health and wellness. What does preventive care include?  A yearly physical exam. This is also called an annual well check.  Dental exams once or twice a year.  Routine eye exams. Ask your health care provider how often you should have your eyes checked.  Personal lifestyle choices, including: ? Daily care of your teeth and gums. ? Regular physical activity. ? Eating a healthy diet. ? Avoiding tobacco and drug use. ? Limiting alcohol use. ? Practicing safe sex. ? Taking low-dose aspirin daily starting at age 40. ? Taking vitamin and mineral supplements as recommended by your health care provider. What happens during an annual well check? The services and screenings done by your health care provider during your annual well check will depend on your age, overall health, lifestyle risk factors, and family history of disease. Counseling Your health care provider may ask you questions about your:  Alcohol use.  Tobacco use.  Drug use.  Emotional well-being.  Home and relationship well-being.  Sexual activity.  Eating habits.  Work and work  Statistician.  Method of birth control.  Menstrual cycle.  Pregnancy history.  Screening You may have the following tests or measurements:  Height, weight, and BMI.  Blood pressure.  Lipid and cholesterol levels. These may be checked every 5 years, or more frequently if you are over 53 years old.  Skin check.  Lung cancer screening. You may have this screening every year starting at age 11 if you have a 30-pack-year history of smoking and currently smoke or have quit within the past 15 years.  Fecal occult blood test (FOBT) of the stool. You may have this test every year starting at age 61.  Flexible sigmoidoscopy or colonoscopy. You may have a sigmoidoscopy every 5 years or a colonoscopy every 10 years starting at age 18.  Hepatitis C blood test.  Hepatitis B blood test.  Sexually transmitted disease (STD) testing.  Diabetes screening. This is done by checking your blood sugar (glucose) after you have not eaten for a while (fasting). You may have this done every 1-3 years.  Mammogram. This may be done every 1-2 years. Talk to your health care provider about when you should start having regular mammograms. This may depend on whether you have a family history of breast cancer.  BRCA-related cancer screening. This may be done if you have a family history of breast, ovarian, tubal, or peritoneal cancers.  Pelvic exam and Pap test. This may be done every 3 years starting at age 36. Starting at age 8, this may be done every 5 years if you have a Pap test in combination with an HPV test.  Bone density scan. This is done to screen  for osteoporosis. You may have this scan if you are at high risk for osteoporosis.  Discuss your test results, treatment options, and if necessary, the need for more tests with your health care provider. Vaccines Your health care provider may recommend certain vaccines, such as:  Influenza vaccine. This is recommended every year.  Tetanus,  diphtheria, and acellular pertussis (Tdap, Td) vaccine. You may need a Td booster every 10 years.  Varicella vaccine. You may need this if you have not been vaccinated.  Zoster vaccine. You may need this after age 58.  Measles, mumps, and rubella (MMR) vaccine. You may need at least one dose of MMR if you were born in 1957 or later. You may also need a second dose.  Pneumococcal 13-valent conjugate (PCV13) vaccine. You may need this if you have certain conditions and were not previously vaccinated.  Pneumococcal polysaccharide (PPSV23) vaccine. You may need one or two doses if you smoke cigarettes or if you have certain conditions.  Meningococcal vaccine. You may need this if you have certain conditions.  Hepatitis A vaccine. You may need this if you have certain conditions or if you travel or work in places where you may be exposed to hepatitis A.  Hepatitis B vaccine. You may need this if you have certain conditions or if you travel or work in places where you may be exposed to hepatitis B.  Haemophilus influenzae type b (Hib) vaccine. You may need this if you have certain conditions.  Talk to your health care provider about which screenings and vaccines you need and how often you need them. This information is not intended to replace advice given to you by your health care provider. Make sure you discuss any questions you have with your health care provider. Document Released: 09/27/2015 Document Revised: 05/20/2016 Document Reviewed: 07/02/2015 Elsevier Interactive Patient Education  [employer].

## 2018-02-03 NOTE — Assessment & Plan Note (Signed)
Td UTD. Following with GYN for pap smear and mammogram.  Discussed the importance of a healthy diet and regular exercise in order for weight loss, and to reduce the risk of any potential medical problems. Exam unremarkable. Labs pending. Follow up in 1 year.

## 2018-02-03 NOTE — Assessment & Plan Note (Signed)
Recommended regular exercise, increase vegetables, fruit, whole grains in diet. Lipids pending.

## 2018-02-03 NOTE — Assessment & Plan Note (Signed)
Intermittent symptoms with bouts of hunger, taking Zantac PRN.

## 2018-02-03 NOTE — Assessment & Plan Note (Signed)
Discussed calorie counting. Recommended weight management programs through [facility] or [employer]. Discussed the importance of a healthy diet and regular exercise in order for weight loss, and to reduce the risk of any potential medical problems.

## 2018-02-04 ENCOUNTER — Other Ambulatory Visit: Payer: Self-pay | Admitting: Internal Medicine

## 2018-02-04 MED ORDER — VITAMIN D (ERGOCALCIFEROL) 1.25 MG (50000 UNIT) PO CAPS: 50000.0000 [IU] | ORAL_CAPSULE | ORAL | 0 refills | Status: DC

## 2018-02-04 NOTE — Progress Notes (Signed)
e

## 2018-02-08 ENCOUNTER — Encounter (INDEPENDENT_AMBULATORY_CARE_PROVIDER_SITE_OTHER): Payer: Self-pay

## 2018-03-07 ENCOUNTER — Other Ambulatory Visit: Payer: Self-pay | Admitting: Internal Medicine

## 2018-04-21 ENCOUNTER — Other Ambulatory Visit: Payer: Self-pay | Admitting: Primary Care

## 2018-04-21 DIAGNOSIS — E559 Vitamin D deficiency, unspecified: Secondary | ICD-10-CM

## 2018-04-27 ENCOUNTER — Other Ambulatory Visit: Payer: Managed Care, Other (non HMO)

## 2018-04-27 DIAGNOSIS — E559 Vitamin D deficiency, unspecified: Secondary | ICD-10-CM

## 2018-04-28 ENCOUNTER — Other Ambulatory Visit (INDEPENDENT_AMBULATORY_CARE_PROVIDER_SITE_OTHER): Payer: Managed Care, Other (non HMO)

## 2018-04-28 DIAGNOSIS — E559 Vitamin D deficiency, unspecified: Secondary | ICD-10-CM

## 2018-04-29 LAB — VITAMIN D 25 HYDROXY (VIT D DEFICIENCY, FRACTURES): VIT D 25 HYDROXY: 24.7 ng/mL — AB (ref 30.0–100.0)

## 2019-02-01 ENCOUNTER — Other Ambulatory Visit: Payer: Self-pay | Admitting: Primary Care

## 2019-02-01 DIAGNOSIS — E785 Hyperlipidemia, unspecified: Secondary | ICD-10-CM

## 2019-02-01 DIAGNOSIS — R7303 Prediabetes: Secondary | ICD-10-CM

## 2019-02-01 DIAGNOSIS — E559 Vitamin D deficiency, unspecified: Secondary | ICD-10-CM

## 2019-02-03 ENCOUNTER — Other Ambulatory Visit: Payer: Managed Care, Other (non HMO)

## 2019-02-08 ENCOUNTER — Encounter: Payer: Managed Care, Other (non HMO) | Admitting: Primary Care

## 2019-02-09 ENCOUNTER — Other Ambulatory Visit (INDEPENDENT_AMBULATORY_CARE_PROVIDER_SITE_OTHER): Payer: Managed Care, Other (non HMO)

## 2019-02-09 DIAGNOSIS — R7303 Prediabetes: Secondary | ICD-10-CM | POA: Diagnosis not present

## 2019-02-09 DIAGNOSIS — E785 Hyperlipidemia, unspecified: Secondary | ICD-10-CM

## 2019-02-09 DIAGNOSIS — E559 Vitamin D deficiency, unspecified: Secondary | ICD-10-CM

## 2019-02-09 LAB — COMPREHENSIVE METABOLIC PANEL
ALT: 13 U/L (ref 0–35)
AST: 12 U/L (ref 0–37)
Albumin: 3.6 g/dL (ref 3.5–5.2)
Alkaline Phosphatase: 72 U/L (ref 39–117)
BUN: 15 mg/dL (ref 6–23)
CO2: 24 mEq/L (ref 19–32)
Calcium: 8.7 mg/dL (ref 8.4–10.5)
Chloride: 104 mEq/L (ref 96–112)
Creatinine, Ser: 0.69 mg/dL (ref 0.40–1.20)
GFR: 111.9 mL/min (ref >60.00)
Glucose, Bld: 102 mg/dL — ABNORMAL HIGH (ref 70–99)
Potassium: 3.9 mEq/L (ref 3.5–5.1)
Sodium: 136 mEq/L (ref 135–145)
Total Bilirubin: 0.4 mg/dL (ref 0.2–1.2)
Total Protein: 7.1 g/dL (ref 6.0–8.3)

## 2019-02-09 LAB — HEMOGLOBIN A1C: Hgb A1c MFr Bld: 6.5 % (ref 4.6–6.5)

## 2019-02-09 LAB — LIPID PANEL
Cholesterol: 182 mg/dL (ref 0–200)
HDL: 32.6 mg/dL — ABNORMAL LOW (ref >39.00)
LDL Cholesterol: 125 mg/dL — ABNORMAL HIGH (ref 0–99)
NonHDL: 149.32
Total CHOL/HDL Ratio: 6
Triglycerides: 123 mg/dL (ref 0.0–149.0)
VLDL: 24.6 mg/dL (ref 0.0–40.0)

## 2019-02-09 LAB — VITAMIN D 25 HYDROXY (VIT D DEFICIENCY, FRACTURES): VITD: 10.83 ng/mL — ABNORMAL LOW (ref 30.00–100.00)

## 2019-02-10 ENCOUNTER — Telehealth (INDEPENDENT_AMBULATORY_CARE_PROVIDER_SITE_OTHER): Payer: Managed Care, Other (non HMO) | Admitting: Primary Care

## 2019-02-10 ENCOUNTER — Encounter: Payer: Self-pay | Admitting: Primary Care

## 2019-02-10 VITALS — Wt 268.0 lb

## 2019-02-10 DIAGNOSIS — E559 Vitamin D deficiency, unspecified: Secondary | ICD-10-CM | POA: Diagnosis not present

## 2019-02-10 DIAGNOSIS — E119 Type 2 diabetes mellitus without complications: Secondary | ICD-10-CM

## 2019-02-10 DIAGNOSIS — Z0000 Encounter for general adult medical examination without abnormal findings: Secondary | ICD-10-CM | POA: Diagnosis not present

## 2019-02-10 DIAGNOSIS — E785 Hyperlipidemia, unspecified: Secondary | ICD-10-CM | POA: Diagnosis not present

## 2019-02-10 MED ORDER — METFORMIN HCL ER 500 MG PO TB24: 500.0000 mg | ORAL_TABLET | Freq: Every day | ORAL | 1 refills | Status: DC

## 2019-02-10 MED ORDER — VITAMIN D (ERGOCALCIFEROL) 1.25 MG (50000 UNIT) PO CAPS: ORAL_CAPSULE | ORAL | 1 refills | Status: DC

## 2019-02-10 NOTE — Progress Notes (Signed)
Subjective:    Patient ID: Tracy Henry, female    DOB: 1976/04/25, 43 y.o.   MRN: [id number]  HPI  Virtual Visit via Video Note  I connected with Tracy Henry on 02/10/19 at  2:20 PM EDT by a video enabled telemedicine application and verified that I am speaking with the correct person using two identifiers.  Location: Patient: Home Provider: Office   I discussed the limitations of evaluation and management by telemedicine and the availability of in person appointments. The patient expressed understanding and agreed to proceed.  History of Present Illness:  Tracy Henry is a 43 year old female who presents today for complete physical.  Immunizations: -Tetanus: Completed in 2012 -Influenza: Due this season   Diet: She endorse a fair diet. She endorses eating little vegetables and fruit. Mostly eating lean meat, vegetables. Some candy, chips, desserts. She is drinking mostly water.   Exercise: She is not exercising. Eye exam: Completed in 2019 Dental exam: No recent exam Pap Smear: Up to date, follows with GYN. Mammogram: Completed last year, due again. Follows with GYN.    Observations/Objective:  Alert and oriented. Appears well, not sickly. No distress. Speaking in complete sentences.   Assessment and Plan:  See problem based charting.  Follow Up Instructions:  Start Metformin ER 500 mg tablets for diabetes. Take 1 tablet by mouth every morning with breakfast.  Start Vitamin D 50,000 unit capsules. Take 1 capsule by mouth once weekly.  Start exercising. You should be getting 150 minutes of moderate intensity exercise weekly.  It is important that you improve your diet. Please limit carbohydrates in the form of white bread, rice, pasta, sweets, fast food, fried food, sugary drinks, etc. Increase your consumption of fresh fruits and vegetables, whole grains, lean protein.  Ensure you are consuming 64 ounces of water daily.  Schedule a follow up visit with me for  3 months.  It was a pleasure to see you today! Allie Bossier, NP-C    I discussed the assessment and treatment plan with the patient. The patient was provided an opportunity to ask questions and all were answered. The patient agreed with the plan and demonstrated an understanding of the instructions.   The patient was advised to call back or seek an in-person evaluation if the symptoms worsen or if the condition fails to improve as anticipated.     Pleas Koch, NP    Review of Systems  Constitutional: Negative for unexpected weight change.  HENT: Negative for rhinorrhea.   Respiratory: Negative for cough and shortness of breath.   Cardiovascular: Negative for chest pain.  Gastrointestinal: Negative for constipation and diarrhea.  Genitourinary: Negative for difficulty urinating.  Musculoskeletal: Negative for arthralgias and myalgias.  Skin: Negative for rash.  Allergic/Immunologic: Positive for environmental allergies.  Neurological: Negative for dizziness, numbness and headaches.  Psychiatric/Behavioral: The patient is not nervous/anxious.        Past Medical History:  Diagnosis Date  . Anemia   . GERD (gastroesophageal reflux disease)    occasional, diet controlled  . Hyperlipidemia    diet controlled, no meds  . Uterine polyp    Removed in 2010     Social History   Socioeconomic History  . Marital status: Married    Spouse name: Not on file  . Number of children: Not on file  . Years of education: Not on file  . Highest education level: Not on file  Occupational History  . Not on file  Social Needs  . Financial resource strain: Not on file  . Food insecurity:    Worry: Not on file    Inability: Not on file  . Transportation needs:    Medical: Not on file    Non-medical: Not on file  Tobacco Use  . Smoking status: Never Smoker  . Smokeless tobacco: Never Used  Substance and Sexual Activity  . Alcohol use: No    Alcohol/week: 0.0 standard drinks   . Drug use: No  . Sexual activity: Yes    Birth control/protection: None  Lifestyle  . Physical activity:    Days per week: Not on file    Minutes per session: Not on file  . Stress: Not on file  Relationships  . Social connections:    Talks on phone: Not on file    Gets together: Not on file    Attends religious service: Not on file    Active member of club or organization: Not on file    Attends meetings of clubs or organizations: Not on file    Relationship status: Not on file  . Intimate partner violence:    Fear of current or ex partner: Not on file    Emotionally abused: Not on file    Physically abused: Not on file    Forced sexual activity: Not on file  Other Topics Concern  . Not on file  Social History Narrative   Married for 17 years    No children   Work for [employer] for 17 years   Enjoys reading   From [place]    Past Surgical History:  Procedure Laterality Date  . CHOLECYSTECTOMY  around 2009  . DILATATION & CURRETTAGE/HYSTEROSCOPY WITH RESECTOCOPE N/A 04/02/2015   Procedure: DILATATION & CURETTAGE/HYSTEROSCOPY WITH RESECTOCOPE;  Surgeon: Brien Few, MD;  Location: [facility];  Service: Gynecology;  Laterality: N/A;  . DILATION AND CURETTAGE OF UTERUS    . TONSILLECTOMY AND ADENOIDECTOMY  1984 or 1985    Family History  Problem Relation Age of Onset  . Hypertension Father   . Cancer Paternal Grandmother        Ovarian cancer, breast?  . Hypertension Mother   . Breast cancer Sister 37    No Known Allergies  Current Outpatient Medications on File Prior to Visit  Medication Sig Dispense Refill  . azelastine (ASTELIN) 0.1 % nasal spray Place 1 spray into both nostrils 2 (two) times daily. Use in each nostril as directed     No current facility-administered medications on file prior to visit.     Wt 268 lb (121.6 kg)   LMP 01/15/2019   BMI 50.64 kg/m    Objective:   Physical Exam  Constitutional: She is oriented to person, place, and time.  She appears well-nourished.  Eyes: Conjunctivae are normal.  Neck: Neck supple.  Respiratory: Effort normal. No respiratory distress.  Musculoskeletal: Normal range of motion.  Neurological: She is alert and oriented to person, place, and time.  Skin: Skin is dry.  Psychiatric: She has a normal mood and affect.           Assessment & Plan:

## 2019-02-10 NOTE — Assessment & Plan Note (Signed)
New diagnosis with A1C of 6.5 on recent labs. Discussed diabetes management and risk factors for ongoing complications from diabetes.  She agreed to treatment with Metformin ER 500 mg. She has been on regular metformin in the past which has caused diarrhea. Rx sent to pharmacy.  We will see her physically in the office in 3 months for foot exam, pneumonia vaccination, urine microalbumin, and repeat A1C.

## 2019-02-10 NOTE — Patient Instructions (Signed)
Start Metformin ER 500 mg tablets for diabetes. Take 1 tablet by mouth every morning with breakfast.  Start Vitamin D 50,000 unit capsules. Take 1 capsule by mouth once weekly.  Start exercising. You should be getting 150 minutes of moderate intensity exercise weekly.  It is important that you improve your diet. Please limit carbohydrates in the form of white bread, rice, pasta, sweets, fast food, fried food, sugary drinks, etc. Increase your consumption of fresh fruits and vegetables, whole grains, lean protein.  Ensure you are consuming 64 ounces of water daily.  Schedule a follow up visit with me for 3 months.  It was a pleasure to see you today! Allie Bossier, NP-C

## 2019-02-10 NOTE — Assessment & Plan Note (Signed)
Chronic, worse on recent labs. Is not taking OTC vitamin D. Rx for vitamin D 50,000 unit capsules sent to pharmacy. Will repeat in 3 months.

## 2019-02-10 NOTE — Assessment & Plan Note (Signed)
Tetanus UTD, will provide her with pneumonia vaccination at next in person visit. Pap smear UTD. Mammogram due, she follows through GYN. Virtual exam unremarkable. Labs reviewed. Follow up in 1 year for CPE.

## 2019-02-10 NOTE — Assessment & Plan Note (Signed)
Repeat lipids worse and above goal. She would like to work on healthy lifestyle changes. Given new diagnosis of diabetes we discussed the risk for heart disease/stroke if lipids remained elevated.  She will work on lifestyle changes, we will repeat lipids in 3 months.

## 2019-03-20 ENCOUNTER — Other Ambulatory Visit (HOSPITAL_COMMUNITY)
Admission: RE | Admit: 2019-03-20 | Discharge: 2019-03-20 | Disposition: A | Discharge status: Home / Self Care | Payer: Managed Care, Other (non HMO) | Source: Ambulatory Visit | Attending: Obstetrics and Gynecology | Admitting: Obstetrics and Gynecology

## 2019-03-20 DIAGNOSIS — Z1159 Encounter for screening for other viral diseases: Secondary | ICD-10-CM | POA: Diagnosis not present

## 2019-03-20 DIAGNOSIS — Z01812 Encounter for preprocedural laboratory examination: Secondary | ICD-10-CM | POA: Insufficient documentation

## 2019-03-21 ENCOUNTER — Encounter (HOSPITAL_BASED_OUTPATIENT_CLINIC_OR_DEPARTMENT_OTHER): Payer: Self-pay | Admitting: *Deleted

## 2019-03-21 ENCOUNTER — Other Ambulatory Visit: Payer: Self-pay

## 2019-03-21 LAB — SARS CORONAVIRUS 2 (TAT 6-24 HRS): SARS Coronavirus 2: NEGATIVE

## 2019-03-21 NOTE — Progress Notes (Signed)

## 2019-03-21 NOTE — Progress Notes (Addendum)
Spoke w/ pt via phone for pre-op interview.  Npo after mn.  Arrive at 1030.  Need istat, ekg, and urine preg.  Pt had covid test done yesterday.  Pre-op orders pending, spoke w/ dr Kerin Perna this morning via his cell phone @0830 .  Pt started her menses this past Sunday 03-19-2019,  advised pt to call dr Kerin Perna office to let him be aware if possible issue with proceeding with procedure.  Pt verbalized understanding and stated that she would.  Chart to be reviewed by anesthesia, Konrad Felix PA, due to BMI 51.12.  ADDENDUM:  Pt chart reviewed by anesthesia, Konrad Felix PA and spoke w/ dr germeroth via phone stated ok to proceed and pt will be assessed by anesthesia dos.

## 2019-03-22 ENCOUNTER — Ambulatory Visit (HOSPITAL_BASED_OUTPATIENT_CLINIC_OR_DEPARTMENT_OTHER): Payer: Managed Care, Other (non HMO) | Admitting: Physician Assistant

## 2019-03-22 ENCOUNTER — Encounter (HOSPITAL_BASED_OUTPATIENT_CLINIC_OR_DEPARTMENT_OTHER)
Admission: RE | Disposition: A | Discharge status: Home / Self Care | Payer: Self-pay | Source: Home / Self Care | Attending: Obstetrics and Gynecology

## 2019-03-22 ENCOUNTER — Encounter (HOSPITAL_BASED_OUTPATIENT_CLINIC_OR_DEPARTMENT_OTHER): Payer: Self-pay

## 2019-03-22 ENCOUNTER — Ambulatory Visit (HOSPITAL_BASED_OUTPATIENT_CLINIC_OR_DEPARTMENT_OTHER)
Admission: RE | Admit: 2019-03-22 | Discharge: 2019-03-22 | Disposition: A | Discharge status: Home / Self Care | Payer: Managed Care, Other (non HMO) | Attending: Obstetrics and Gynecology | Admitting: Obstetrics and Gynecology

## 2019-03-22 DIAGNOSIS — Z7984 Long term (current) use of oral hypoglycemic drugs: Secondary | ICD-10-CM | POA: Diagnosis not present

## 2019-03-22 DIAGNOSIS — N840 Polyp of corpus uteri: Secondary | ICD-10-CM | POA: Diagnosis not present

## 2019-03-22 DIAGNOSIS — E119 Type 2 diabetes mellitus without complications: Secondary | ICD-10-CM | POA: Insufficient documentation

## 2019-03-22 DIAGNOSIS — Z6841 Body Mass Index (BMI) 40.0 and over, adult: Secondary | ICD-10-CM | POA: Diagnosis not present

## 2019-03-22 HISTORY — DX: Other allergy status, other than to drugs and biological substances: Z91.09

## 2019-03-22 HISTORY — DX: Type 2 diabetes mellitus without complications: E11.9

## 2019-03-22 HISTORY — DX: Iron deficiency anemia, unspecified: D50.9

## 2019-03-22 HISTORY — PX: HYSTEROSCOPY WITH NOVASURE: SHX5574

## 2019-03-22 HISTORY — DX: Morbid (severe) obesity due to excess calories: E66.01

## 2019-03-22 HISTORY — DX: Presence of spectacles and contact lenses: Z97.3

## 2019-03-22 LAB — POCT I-STAT, CHEM 8
BUN: 9 mg/dL (ref 6–20)
Calcium, Ion: 1.22 mmol/L (ref 1.15–1.40)
Chloride: 102 mmol/L (ref 98–111)
Creatinine, Ser: 0.7 mg/dL (ref 0.44–1.00)
Glucose, Bld: 98 mg/dL (ref 70–99)
HCT: 41 % (ref 36.0–46.0)
Hemoglobin: 13.9 g/dL (ref 12.0–15.0)
Potassium: 4 mmol/L (ref 3.5–5.1)
Sodium: 138 mmol/L (ref 135–145)
TCO2: 24 mmol/L (ref 22–32)

## 2019-03-22 LAB — TYPE AND SCREEN
ABO/RH(D): A POS
Antibody Screen: NEGATIVE

## 2019-03-22 LAB — ABO/RH: ABO/RH(D): A POS

## 2019-03-22 LAB — GLUCOSE, CAPILLARY: Glucose-Capillary: 90 mg/dL (ref 70–99)

## 2019-03-22 LAB — POCT PREGNANCY, URINE: Preg Test, Ur: NEGATIVE

## 2019-03-22 SURGERY — HYSTEROSCOPY WITH NOVASURE
Anesthesia: General | Site: Uterus

## 2019-03-22 MED ORDER — CEFAZOLIN SODIUM-DEXTROSE 1-4 GM/50ML-% IV SOLN
INTRAVENOUS | Status: AC
  Filled 2019-03-22: qty 50

## 2019-03-22 MED ORDER — ACETAMINOPHEN 500 MG PO TABS
ORAL_TABLET | ORAL | Status: AC
  Filled 2019-03-22: qty 2

## 2019-03-22 MED ORDER — ONDANSETRON HCL 4 MG/2ML IJ SOLN
INTRAMUSCULAR | Status: DC | PRN
  Administered 2019-03-22: 4 mg via INTRAVENOUS

## 2019-03-22 MED ORDER — VASOPRESSIN 20 UNIT/ML IV SOLN
INTRAVENOUS | Status: AC
  Filled 2019-03-22: qty 1

## 2019-03-22 MED ORDER — SODIUM CHLORIDE (PF) 0.9 % IJ SOLN
INTRAMUSCULAR | Status: AC
  Filled 2019-03-22: qty 40

## 2019-03-22 MED ORDER — LETROZOLE 2.5 MG PO TABS: 7.5000 mg | ORAL_TABLET | Freq: Every day | ORAL | 2 refills | Status: AC

## 2019-03-22 MED ORDER — LIDOCAINE 2% (20 MG/ML) 5 ML SYRINGE
INTRAMUSCULAR | Status: DC | PRN
  Administered 2019-03-22: 100 mg via INTRAVENOUS

## 2019-03-22 MED ORDER — PROMETHAZINE HCL 25 MG/ML IJ SOLN
6.2500 mg | INTRAMUSCULAR | Status: DC | PRN
  Filled 2019-03-22: qty 1

## 2019-03-22 MED ORDER — PROPOFOL 10 MG/ML IV BOLUS
INTRAVENOUS | Status: AC
  Filled 2019-03-22: qty 40

## 2019-03-22 MED ORDER — FENTANYL CITRATE (PF) 100 MCG/2ML IJ SOLN
INTRAMUSCULAR | Status: AC
  Filled 2019-03-22: qty 2

## 2019-03-22 MED ORDER — OXYCODONE HCL 5 MG PO TABS
5.0000 mg | ORAL_TABLET | Freq: Once | ORAL | Status: DC | PRN
  Filled 2019-03-22: qty 1

## 2019-03-22 MED ORDER — FENTANYL CITRATE (PF) 100 MCG/2ML IJ SOLN
INTRAMUSCULAR | Status: DC | PRN
  Administered 2019-03-22 (×2): 50 ug via INTRAVENOUS

## 2019-03-22 MED ORDER — ACETAMINOPHEN 500 MG PO TABS
1000.0000 mg | ORAL_TABLET | Freq: Once | ORAL | Status: AC
  Administered 2019-03-22: 1000 mg via ORAL
  Filled 2019-03-22: qty 2

## 2019-03-22 MED ORDER — KETOROLAC TROMETHAMINE 30 MG/ML IJ SOLN
INTRAMUSCULAR | Status: DC | PRN
  Administered 2019-03-22: 30 mg via INTRAVENOUS

## 2019-03-22 MED ORDER — SODIUM CHLORIDE 0.9 % IR SOLN
Status: DC | PRN
  Administered 2019-03-22: 6000 mL

## 2019-03-22 MED ORDER — LACTATED RINGERS IV SOLN
INTRAVENOUS | Status: DC
  Administered 2019-03-22: 11:00:00 via INTRAVENOUS
  Filled 2019-03-22: qty 1000

## 2019-03-22 MED ORDER — FENTANYL CITRATE (PF) 100 MCG/2ML IJ SOLN
25.0000 ug | INTRAMUSCULAR | Status: DC | PRN
  Filled 2019-03-22: qty 1

## 2019-03-22 MED ORDER — MIDAZOLAM HCL 2 MG/2ML IJ SOLN
INTRAMUSCULAR | Status: DC | PRN
  Administered 2019-03-22: 2 mg via INTRAVENOUS

## 2019-03-22 MED ORDER — ACETAMINOPHEN 10 MG/ML IV SOLN
1000.0000 mg | Freq: Once | INTRAVENOUS | Status: DC | PRN
  Filled 2019-03-22: qty 100

## 2019-03-22 MED ORDER — VASOPRESSIN 20 UNIT/ML IV SOLN
INTRAVENOUS | Status: DC | PRN
  Administered 2019-03-22: 60 mL via INTRAMUSCULAR

## 2019-03-22 MED ORDER — MIDAZOLAM HCL 2 MG/2ML IJ SOLN
INTRAMUSCULAR | Status: AC
  Filled 2019-03-22: qty 2

## 2019-03-22 MED ORDER — PROPOFOL 10 MG/ML IV BOLUS
INTRAVENOUS | Status: DC | PRN
  Administered 2019-03-22: 170 mg via INTRAVENOUS

## 2019-03-22 MED ORDER — DEXTROSE 5 % IV SOLN
3.0000 g | INTRAVENOUS | Status: AC
  Administered 2019-03-22: 3 g via INTRAVENOUS
  Filled 2019-03-22: qty 3000

## 2019-03-22 MED ORDER — SODIUM CHLORIDE (PF) 0.9 % IJ SOLN
INTRAMUSCULAR | Status: AC
  Filled 2019-03-22: qty 20

## 2019-03-22 MED ORDER — KETOROLAC TROMETHAMINE 30 MG/ML IJ SOLN
INTRAMUSCULAR | Status: AC
  Filled 2019-03-22: qty 1

## 2019-03-22 MED ORDER — OXYCODONE HCL 5 MG/5ML PO SOLN
5.0000 mg | Freq: Once | ORAL | Status: DC | PRN
  Filled 2019-03-22: qty 5

## 2019-03-22 MED ORDER — CEFAZOLIN SODIUM-DEXTROSE 2-4 GM/100ML-% IV SOLN
INTRAVENOUS | Status: AC
  Filled 2019-03-22: qty 100

## 2019-03-22 MED ORDER — DEXAMETHASONE SODIUM PHOSPHATE 10 MG/ML IJ SOLN
INTRAMUSCULAR | Status: DC | PRN
  Administered 2019-03-22: 5 mg via INTRAVENOUS

## 2019-03-22 SURGICAL SUPPLY — 43 items
BIPOLAR CUTTING LOOP 21FR (ELECTRODE)
CANISTER SUCT 3000ML PPV (MISCELLANEOUS) ×4 IMPLANT
CANNULA CURETTE W/SYR 6 (CANNULA) ×1 IMPLANT
CANNULA CURETTE W/SYR 6MM (CANNULA) ×1
CANNULA CURETTE W/SYR 7 (CANNULA) IMPLANT
CANNULA CURETTE W/SYR 7MM (CANNULA)
CATH HSG 5FRX28CM (CATHETERS) IMPLANT
CATH INTRA ACCESS BALLN (BALLOONS) IMPLANT
CATH ROBINSON RED A/P 16FR (CATHETERS) ×3 IMPLANT
CATH SSG INJECTION W/GUIDEWIRE (BALLOONS) IMPLANT
COVER WAND RF STERILE (DRAPES) ×3 IMPLANT
DEVICE MYOSURE LITE (MISCELLANEOUS) IMPLANT
DEVICE MYOSURE REACH (MISCELLANEOUS) IMPLANT
DILATOR CANAL MILEX (MISCELLANEOUS) IMPLANT
ELECT BIPOLAR POINTED 21FR (MISCELLANEOUS)
ELECT REM PT RETURN 9FT ADLT (ELECTROSURGICAL)
ELECTRODE LOOP CTNG BIPLR 21FR (MISCELLANEOUS) IMPLANT
ELECTRODE REM PT RTRN 9FT ADLT (ELECTROSURGICAL) IMPLANT
GAUZE 4X4 16PLY RFD (DISPOSABLE) ×3 IMPLANT
GLOVE BIO SURGEON STRL SZ8 (GLOVE) ×3 IMPLANT
GLOVE BIOGEL PI IND STRL 8.5 (GLOVE) ×1 IMPLANT
GLOVE BIOGEL PI INDICATOR 8.5 (GLOVE) ×2
GOWN STRL REUS W/ TWL XL LVL3 (GOWN DISPOSABLE) ×2 IMPLANT
GOWN STRL REUS W/TWL XL LVL3 (GOWN DISPOSABLE) ×6
IV NS IRRIG 3000ML ARTHROMATIC (IV SOLUTION) ×6 IMPLANT
KIT PROCEDURE FLUENT (KITS) ×3 IMPLANT
KIT TURNOVER CYSTO (KITS) ×3 IMPLANT
LOOP CUTTING BIPOLAR 21FR (ELECTRODE) IMPLANT
MYOSURE XL FIBROID (MISCELLANEOUS)
PACK VAGINAL MINOR WOMEN LF (CUSTOM PROCEDURE TRAY) ×3 IMPLANT
PAD OB MATERNITY 4.3X12.25 (PERSONAL CARE ITEMS) ×3 IMPLANT
SEAL CERVICAL OMNI LOK (ABLATOR) IMPLANT
SEAL ROD LENS SCOPE MYOSURE (ABLATOR) IMPLANT
SET IRRIG Y TYPE TUR BLADDER L (SET/KITS/TRAYS/PACK) IMPLANT
STENT BALLN UTERINE 3CM 6FR (STENTS) IMPLANT
STENT BALLN UTERINE 4CM 6FR (STENTS) IMPLANT
SUT SILK 2 0 SH (SUTURE) IMPLANT
SUT SILK 3 0 PS 1 (SUTURE) IMPLANT
SYR 20CC LL (SYRINGE) IMPLANT
SYR 3ML 18GX1 1/2 (SYRINGE) ×3 IMPLANT
SYSTEM TISS REMOVAL MYOSURE XL (MISCELLANEOUS) IMPLANT
TOWEL OR 17X26 10 PK STRL BLUE (TOWEL DISPOSABLE) ×4 IMPLANT
WATER STERILE IRR 500ML POUR (IV SOLUTION) ×1 IMPLANT

## 2019-03-22 NOTE — Anesthesia Procedure Notes (Signed)
Procedure Name: LMA Insertion Date/Time: 03/22/2019 1:22 PM Performed by: Wanita Chamberlain, CRNA Pre-anesthesia Checklist: Patient identified, Emergency Drugs available, Suction available and Patient being monitored Patient Re-evaluated:Patient Re-evaluated prior to induction Oxygen Delivery Method: Circle system utilized Preoxygenation: Pre-oxygenation with 100% oxygen Induction Type: IV induction Ventilation: Mask ventilation without difficulty LMA: LMA inserted LMA Size: 4.0 Number of attempts: 1 Placement Confirmation: breath sounds checked- equal and bilateral,  CO2 detector and positive ETCO2 Tube secured with: Tape Dental Injury: Teeth and Oropharynx as per pre-operative assessment

## 2019-03-22 NOTE — Anesthesia Preprocedure Evaluation (Addendum)
Anesthesia Evaluation  Patient identified by MRN, date of birth, ID band Patient awake    Reviewed: Allergy & Precautions, NPO status , Patient's Chart, lab work & pertinent test results  History of Anesthesia Complications Negative for: history of anesthetic complications  Airway Mallampati: II  TM Distance: >3 FB Neck ROM: Limited    Dental  (+) Dental Advisory Given, Teeth Intact   Pulmonary neg pulmonary ROS,    Pulmonary exam normal breath sounds clear to auscultation       Cardiovascular negative cardio ROS   Rhythm:Regular Rate:Normal     Neuro/Psych negative neurological ROS     GI/Hepatic Neg liver ROS, GERD  Controlled,  Endo/Other  diabetes, Type 2, Oral Hypoglycemic AgentsMorbid obesity  Renal/GU negative Renal ROS     Musculoskeletal negative musculoskeletal ROS (+)   Abdominal   Peds  Hematology negative hematology ROS (+) anemia ,   Anesthesia Other Findings Day of surgery medications reviewed with the patient.  Reproductive/Obstetrics                         Anesthesia Physical Anesthesia Plan  ASA: III  Anesthesia Plan: General   Post-op Pain Management:    Induction: Intravenous  PONV Risk Score and Plan: 3 and Treatment may vary due to age or medical condition, Ondansetron, Dexamethasone, Midazolam and Scopolamine patch - Pre-op  Airway Management Planned: Oral ETT  Additional Equipment:   Intra-op Plan:   Post-operative Plan: Extubation in OR  Informed Consent:   Plan Discussed with:   Anesthesia Plan Comments:         Anesthesia Quick Evaluation

## 2019-03-22 NOTE — Anesthesia Postprocedure Evaluation (Signed)
Anesthesia Post Note  Patient: Tracy Henry  Procedure(s) Performed: HYSTEROSCOPY WITH POLYPECTOMY AND DILATATION AND CURRETTAGE (N/A Uterus)     Patient location during evaluation: PACU Anesthesia Type: General Level of consciousness: awake and alert and oriented Pain management: pain level controlled Vital Signs Assessment: post-procedure vital signs reviewed and stable Respiratory status: spontaneous breathing, nonlabored ventilation and respiratory function stable Cardiovascular status: blood pressure returned to baseline and stable Postop Assessment: no apparent nausea or vomiting Anesthetic complications: no    Last Vitals:  Vitals:   03/22/19 1500 03/22/19 1546  BP: (!) 145/80 136/78  Pulse: 65 66  Resp: (!) 21 14  Temp:  36.8 C  SpO2: 94% 100%    Last Pain:  Vitals:   03/22/19 1515  TempSrc:   PainSc: 0-No pain                 Heloise Gordan A.

## 2019-03-22 NOTE — H&P (Addendum)
Tracy Henry is a 43 y.o. female , originally referred to me by Dr. Ronita Hipps, for advanced reproductive age and infertility with history of recurrent endometrial polyps. She has been having monthly periods but with heavy flow and prolonged duration.  Patient would like to preserve her childbearing potential.  Pertinent Gynecological History: Menses: flow is excessive with use of 3 pads or tampons on heaviest days Bleeding: dysfunctional uterine bleeding Contraception: none DES exposure: denies Blood transfusions: none Sexually transmitted diseases: no past history Last mammogram: normal Last pap: normal    Menstrual History: Menarche age: 19 LMP is 03/19/2019  Past Medical History:  Diagnosis Date  . Environmental allergies   . Hyperlipidemia    diet controlled, no meds  . IDA (iron deficiency anemia)   . Morbidly obese ([place])   . Type 2 diabetes mellitus ([place])    followed by pcp  . Uterine polyp    Removed in 2010  . Wears glasses                     Past Surgical History:  Procedure Laterality Date  . DILATATION & CURRETTAGE/HYSTEROSCOPY WITH RESECTOCOPE N/A 04/02/2015   Procedure: DILATATION & CURETTAGE/HYSTEROSCOPY WITH RESECTOCOPE;  Surgeon: Brien Few, MD;  Location: [facility];  Service: Gynecology;  Laterality: N/A;  . HYSTEROSCOPY WITH RESECTOSCOPE  11-11-2009   dr Ronita Hipps @[facility]    polypectomy's  . LAPAROSCOPIC CHOLECYSTECTOMY  09-28-2008   dr b. Grand[place] Silos  @[facility]   . [place]  approx.             Family History  Problem Relation Age of Onset  . Hypertension Mother   . Hypertension Father   . Cancer Paternal Grandmother        Ovarian cancer, breast?  . Breast cancer Sister 57   No hereditary disease.  No cancer of breast, ovary, uterus. No cutaneous leiomyomatosis or renal cell carcinoma.  Social History   Socioeconomic History  . Marital status: Married    Spouse name: Not on file  . Number of children: Not on file  . Years of  education: Not on file  . Highest education level: Not on file  Occupational History  . Not on file  Social Needs  . Financial resource strain: Not on file  . Food insecurity    Worry: Not on file    Inability: Not on file  . Transportation needs    Medical: Not on file    Non-medical: Not on file  Tobacco Use  . Smoking status: Never Smoker  . Smokeless tobacco: Never Used  Substance and Sexual Activity  . Alcohol use: No    Alcohol/week: 0.0 standard drinks  . Drug use: Never  . Sexual activity: Yes    Birth control/protection: None  Lifestyle  . Physical activity    Days per week: Not on file    Minutes per session: Not on file  . Stress: Not on file  Relationships  . Social Herbalist on phone: Not on file    Gets together: Not on file    Attends religious service: Not on file    Active member of club or organization: Not on file    Attends meetings of clubs or organizations: Not on file    Relationship status: Not on file  . Intimate partner violence    Fear of current or ex partner: Not on file    Emotionally abused: Not on file  Physically abused: Not on file    Forced sexual activity: Not on file  Other Topics Concern  . Not on file  Social History Narrative   Married for 17 years    No children   Work for [employer] for 17 years   Enjoys reading   From [place]    No Known Allergies  No current facility-administered medications on file prior to encounter.    Current Outpatient Medications on File Prior to Encounter  Medication Sig Dispense Refill  . azelastine (ASTELIN) 0.1 % nasal spray Place 1 spray into both nostrils 2 (two) times daily. Use in each nostril as directed    . metFORMIN (GLUCOPHAGE-XR) 500 MG 24 hr tablet Take 1 tablet (500 mg total) by mouth daily with breakfast. For diabetes. 90 tablet 1  . Vitamin D, Ergocalciferol, (DRISDOL) 1.25 MG (50000 UT) CAPS capsule Take 1 capsule by mouth once weekly. 12 capsule 1     Review  of Systems  Constitutional: Negative.   HENT: Negative.   Eyes: Negative.   Respiratory: Negative.   Cardiovascular: Negative.   Gastrointestinal: Negative.   Genitourinary: Negative.   Musculoskeletal: Negative.   Skin: Negative.   Neurological: Negative.   Endo/Heme/Allergies: Negative.   Psychiatric/Behavioral: Negative.      Physical Exam  BP (!) 147/91   Pulse 78   Temp 97.9 F (36.6 C) (Oral)   Resp 18   Ht 5\' 1"  (1.549 m)   Wt 122.1 kg   LMP 03/19/2019 (Exact Date)   SpO2 97%   BMI 50.85 kg/m  Constitutional: She is oriented to person, place, and time. She appears well-developed and well-nourished.  HENT:  Head: Normocephalic and atraumatic.  Nose: Nose normal.  Mouth/Throat: Oropharynx is clear and moist. No oropharyngeal exudate.  Eyes: Conjunctivae normal and EOM are normal. Pupils are equal, round, and reactive to light. No scleral icterus.  Neck: Normal range of motion. Neck supple. No tracheal deviation present. No thyromegaly present.  Cardiovascular: Normal rate.   Respiratory: Effort normal and breath sounds normal.  GI: Soft. Bowel sounds are normal. She exhibits no distension and no mass. There is no tenderness.  Lymphadenopathy:    She has no cervical adenopathy.  Neurological: She is alert and oriented to person, place, and time. She has normal reflexes.  Skin: Skin is warm.  Psychiatric: She has a normal mood and affect. Her behavior is normal. Judgment and thought content normal.       Assessment/Plan:  Recurrent endometrial polyps, rule out endometrial hyperplasia, with left ovarian 2.1 cm simple cyst Preoperative for hysteroscopy polypectomy, suction D&C, and possible left ovarian cyst aspiration Benefits and risks of proposed procedures were discussed with the patient again.

## 2019-03-22 NOTE — Discharge Instructions (Signed)
°  Post Anesthesia Home Care Instructions  Activity: Get plenty of rest for the remainder of the day. A responsible individual must stay with you for 24 hours following the procedure.  For the next 24 hours, DO NOT: -Drive a car -Paediatric nurse -Drink alcoholic beverages -Take any medication unless instructed by your physician -Make any legal decisions or sign important papers.  Meals: Start with liquid foods such as gelatin or soup. Progress to regular foods as tolerated. Avoid greasy, spicy, heavy foods. If nausea and/or vomiting occur, drink only clear liquids until the nausea and/or vomiting subsides. Call your physician if vomiting continues.  Special Instructions/Symptoms: Your throat may feel dry or sore from the anesthesia or the breathing tube placed in your throat during surgery. If this causes discomfort, gargle with warm salt water. The discomfort should disappear within 24 ho   DISCHARGE INSTRUCTIONS: Hysteroscopy and Polypectomy The following instructions have been prepared to help you care for yourself upon your return home.   Personal hygiene:  Use sanitary pads for vaginal drainage, not tampons.  Shower the day after your procedure.  NO tub baths, pools or Jacuzzis for 2-3 weeks.  Wipe front to back after using the bathroom.  Activity and limitations:  Do NOT drive or operate any equipment for 24 hours. The effects of anesthesia are still present and drowsiness may result.  Do NOT rest in bed all day.  Walking is encouraged.  Walk up and down stairs slowly.  You may resume your normal activity in one to two days or as indicated by your physician.  Sexual activity: NO intercourse for at least 2 weeks after the procedure, or as indicated by your physician.  Diet: Eat a light meal as desired this evening. You may resume your usual diet tomorrow.  Return to work: You may resume your work activities in one to two days or as indicated by your  doctor.  What to expect after your surgery: Expect to have vaginal bleeding/discharge for 2-3 days and spotting for up to 10 days. It is not unusual to have soreness for up to 1-2 weeks. You may have a slight burning sensation when you urinate for the first day. Mild cramps may continue for a couple of days. You may have a regular period in 2-6 weeks.  Call your doctor for any of the following:  Excessive vaginal bleeding, saturating and changing one pad every hour.  Inability to urinate 6 hours after discharge from hospital.  Pain not relieved by pain medication.  Fever of 100.4 F or greater.  Unusual vaginal discharge or odor.   Call for an appointment: As directed by the Dr.    .

## 2019-03-22 NOTE — Transfer of Care (Signed)
Immediate Anesthesia Transfer of Care Note  Patient: Tracy Henry  Procedure(s) Performed: HYSTEROSCOPY WITH POLYPECTOMY AND DILATATION AND CURRETTAGE (N/A Uterus)  Patient Location: PACU  Anesthesia Type:General  Level of Consciousness: awake, alert , oriented and patient cooperative  Airway & Oxygen Therapy: Patient Spontanous Breathing and Patient connected to nasal cannula oxygen  Post-op Assessment: Report given to RN and Post -op Vital signs reviewed and stable  Post vital signs: Reviewed and stable  Last Vitals:  Vitals Value Taken Time  BP    Temp    Pulse    Resp    SpO2      Last Pain:  Vitals:   03/22/19 1026  TempSrc: Oral  PainSc: 0-No pain      Patients Stated Pain Goal: 5 (29/09/03 0149)  Complications: No apparent anesthesia complications

## 2019-03-22 NOTE — Op Note (Signed)
OPERATIVE NOTE  Preoperative diagnosis: Recurrent endometrial polyp, left ovarian simple cyst, morbid obesity  Postoperative diagnosis: Recurrent endometrial polyp, morbid obesity  Procedure: Hysteroscopy, polypectomy,  D&C  Surgeon: Governor Specking  Anesthesia: General  Complications: None  Estimated blood loss: Less than 20 mL  Specimen: Endometrial polyps and curettings to pathology  Findings: Exam under anesthesia showed normal external genitalia and vagina and cervix.  The uterus was retroverted, top of normal sized, and mobile.  There were no ovarian cysts or adnexal masses palpable.  No posterior fornix nodularity was palpated.   Endocervical canal appeared normal. The uterus sounded to 10.5 cm. Endometrial cavity had 5 x 9 mm posterior left sessile polyp as well as a ridge like thickening of the posterior endometrium at the upper part of the cavity. Otherwise it was of normal appearance and normal configuration. Both tubal ostia were seen.  Description of procedure: Patient was placed in dorsal supine position. General anesthesia was administered. She was placed in lithotomy position. She was prepped and draped in sterile manner. A vaginal speculum was placed. A dilute vasopressin solution containing 0.33 units per milliliter was injected into the cervical stroma x5 cc. A Slimline hysteroscope with 30 lens was inserted into the canal and above findings were noted. Distention medium was normal saline. Distention method was a hysteroscopic pump set at 80 mm mercury. Above findings were noted.  Using hysteroscopic scissors the polyp was excised right at its stalk and then grasped with hysteroscopic graspers and removed and submitted to pathology. Using manual evacuation device with a 6 mm curette attached to it, suction curettage was performed.  Sharp curettage followed this and the hysteroscopy was repeated to ensure complete removal of the polyp and polypoid ridge. The specimen was sent  to pathology.  Hemostasis was insured. Instrument count was correct. Estimated blood loss was less than 20 mL. The patient tolerated the procedure well and was transferred to recovery in satisfactory condition. Patient was given a prescription for Femara 7.5 mg days 4 through 8 and we will see her in 7 to 10 days to check for follicular response.  Marland KitchenGovernor Specking, MD

## 2019-03-23 ENCOUNTER — Encounter (HOSPITAL_BASED_OUTPATIENT_CLINIC_OR_DEPARTMENT_OTHER): Payer: Self-pay | Admitting: Obstetrics and Gynecology

## 2019-05-15 ENCOUNTER — Other Ambulatory Visit: Payer: Self-pay | Admitting: Primary Care

## 2019-05-15 ENCOUNTER — Encounter: Payer: Self-pay | Admitting: Primary Care

## 2019-05-15 ENCOUNTER — Ambulatory Visit: Payer: Managed Care, Other (non HMO) | Admitting: Primary Care

## 2019-05-15 ENCOUNTER — Other Ambulatory Visit: Payer: Self-pay

## 2019-05-15 VITALS — BP 134/84 | HR 97 | Temp 98.6°F | Ht 61.0 in | Wt 275.5 lb

## 2019-05-15 DIAGNOSIS — E119 Type 2 diabetes mellitus without complications: Secondary | ICD-10-CM | POA: Diagnosis not present

## 2019-05-15 DIAGNOSIS — E559 Vitamin D deficiency, unspecified: Secondary | ICD-10-CM

## 2019-05-15 DIAGNOSIS — E785 Hyperlipidemia, unspecified: Secondary | ICD-10-CM

## 2019-05-15 LAB — BASIC METABOLIC PANEL
BUN: 9 mg/dL (ref 6–23)
CO2: 22 mEq/L (ref 19–32)
Calcium: 9 mg/dL (ref 8.4–10.5)
Chloride: 106 mEq/L (ref 96–112)
Creatinine, Ser: 0.69 mg/dL (ref 0.40–1.20)
GFR: 111.77 mL/min (ref >60.00)
Glucose, Bld: 97 mg/dL (ref 70–99)
Potassium: 4.1 mEq/L (ref 3.5–5.1)
Sodium: 137 mEq/L (ref 135–145)

## 2019-05-15 LAB — POCT GLYCOSYLATED HEMOGLOBIN (HGB A1C): Hemoglobin A1C: 5.9 % — AB (ref 4.0–5.6)

## 2019-05-15 LAB — LIPID PANEL
Cholesterol: 179 mg/dL (ref 0–200)
HDL: 37.9 mg/dL — ABNORMAL LOW (ref >39.00)
LDL Cholesterol: 117 mg/dL — ABNORMAL HIGH (ref 0–99)
NonHDL: 141.17
Total CHOL/HDL Ratio: 5
Triglycerides: 121 mg/dL (ref 0.0–149.0)
VLDL: 24.2 mg/dL (ref 0.0–40.0)

## 2019-05-15 LAB — MICROALBUMIN / CREATININE URINE RATIO
Creatinine,U: 86.8 mg/dL
Microalb Creat Ratio: 1.8 mg/g (ref 0.0–30.0)
Microalb, Ur: 1.6 mg/dL (ref 0.0–1.9)

## 2019-05-15 LAB — VITAMIN D 25 HYDROXY (VIT D DEFICIENCY, FRACTURES): VITD: 19.32 ng/mL — ABNORMAL LOW (ref 30.00–100.00)

## 2019-05-15 NOTE — Progress Notes (Signed)
Subjective:    Patient ID: Tracy Henry, female    DOB: February 26, 1976, 43 y.o.   MRN: [id number]  HPI  Tracy Henry is a 43 year old female who presents today for follow up of diabetes and vitamin D.   She was last evaluated on 02/10/19 for her CPE, recent labs revealed a A1C of 6.5. She was initiated on metformin ER 500 mg daily and asked to follow up today.   Since her last visit she's missed several Metformin doses. She is taking her Metformin every other day on average.   Last Eye Exam: No recent exam Last Foot Exam: Due today Pneumonia Vaccination: Never completed ACE/ARB: None. Urine microalbumin pending.  Statin: None. LDL of 125   She recently started counting her calories and is aiming to get 10,000 steps daily.   BP Readings from Last 3 Encounters:  05/15/19 134/84  03/22/19 136/78  02/03/18 126/74   She ran out of her vitamin D capsules several weeks ago. Has a prescription at the pharmacy but hasn't picked it up. She was compliant weekly for two months.   Review of Systems  Eyes: Negative for visual disturbance.  Respiratory: Negative for shortness of breath.   Cardiovascular: Negative for chest pain.  Neurological: Negative for dizziness and numbness.       Past Medical History:  Diagnosis Date  . Environmental allergies   . Hyperlipidemia    diet controlled, no meds  . IDA (iron deficiency anemia)   . Morbidly obese ([place])   . Type 2 diabetes mellitus ([place])    followed by pcp  . Uterine polyp    Removed in 2010  . Wears glasses      Social History   Socioeconomic History  . Marital status: Married    Spouse name: Not on file  . Number of children: Not on file  . Years of education: Not on file  . Highest education level: Not on file  Occupational History  . Not on file  Social Needs  . Financial resource strain: Not on file  . Food insecurity    Worry: Not on file    Inability: Not on file  . Transportation needs    Medical: Not on file   Non-medical: Not on file  Tobacco Use  . Smoking status: Never Smoker  . Smokeless tobacco: Never Used  Substance and Sexual Activity  . Alcohol use: No    Alcohol/week: 0.0 standard drinks  . Drug use: Never  . Sexual activity: Yes    Birth control/protection: None  Lifestyle  . Physical activity    Days per week: Not on file    Minutes per session: Not on file  . Stress: Not on file  Relationships  . Social Herbalist on phone: Not on file    Gets together: Not on file    Attends religious service: Not on file    Active member of club or organization: Not on file    Attends meetings of clubs or organizations: Not on file    Relationship status: Not on file  . Intimate partner violence    Fear of current or ex partner: Not on file    Emotionally abused: Not on file    Physically abused: Not on file    Forced sexual activity: Not on file  Other Topics Concern  . Not on file  Social History Narrative   Married for 17 years    No children  Work for [employer] for 17 years   Enjoys reading   From [place]    Past Surgical History:  Procedure Laterality Date  . DILATATION & CURRETTAGE/HYSTEROSCOPY WITH RESECTOCOPE N/A 04/02/2015   Procedure: DILATATION & CURETTAGE/HYSTEROSCOPY WITH RESECTOCOPE;  Surgeon: Brien Few, MD;  Location: [facility];  Service: Gynecology;  Laterality: N/A;  . HYSTEROSCOPY WITH NOVASURE N/A 03/22/2019   Procedure: HYSTEROSCOPY WITH POLYPECTOMY AND DILATATION AND CURRETTAGE;  Surgeon: Governor Specking, MD;  Location: [facility];  Service: Gynecology;  Laterality: N/A;  . HYSTEROSCOPY WITH RESECTOSCOPE  11-11-2009   dr Ronita Hipps @[facility]    polypectomy's  . LAPAROSCOPIC CHOLECYSTECTOMY  09-28-2008   dr b. Grandville Silos  @[facility]   . [place]  approx.    Family History  Problem Relation Age of Onset  . Hypertension Mother   . Hypertension Father   . Cancer Paternal Grandmother        Ovarian cancer, breast?  .  Breast cancer Sister 30    No Known Allergies  Current Outpatient Medications on File Prior to Visit  Medication Sig Dispense Refill  . azelastine (ASTELIN) 0.1 % nasal spray Place 1 spray into both nostrils 2 (two) times daily. Use in each nostril as directed    . metFORMIN (GLUCOPHAGE-XR) 500 MG 24 hr tablet Take 1 tablet (500 mg total) by mouth daily with breakfast. For diabetes. 90 tablet 1  . Vitamin D, Ergocalciferol, (DRISDOL) 1.25 MG (50000 UT) CAPS capsule Take 1 capsule by mouth once weekly. (Patient not taking: Reported on 05/15/2019) 12 capsule 1   No current facility-administered medications on file prior to visit.     BP 134/84   Pulse 97   Temp 98.6 F (37 C) (Temporal)   Ht 5\' 1"  (1.549 m)   Wt 275 lb 8 oz (125 kg)   LMP 03/22/2019   SpO2 97%   BMI 52.06 kg/m    Objective:   Physical Exam  Constitutional: She appears well-nourished.  Neck: Neck supple.  Cardiovascular: Normal rate and regular rhythm.  Respiratory: Effort normal and breath sounds normal.  Skin: Skin is warm and dry.  Psychiatric: She has a normal mood and affect.           Assessment & Plan:

## 2019-05-15 NOTE — Assessment & Plan Note (Addendum)
Missing several doses of Metformin weekly, discussed importance of resuming daily. A1C today of 5.9 which is under good control.  Foot exam today. Declines pneumonia vaccination.  Urine microalbumin pending. Repeat lipids pending. She is working on diet and exercise. She will schedule an eye exam.   Follow up in 6 months.

## 2019-05-15 NOTE — Patient Instructions (Addendum)
Stop by the lab prior to leaving today. I will notify you of your results once received.   Continue metformin ER 500 mg once daily for diabetes.   Start exercising. You should be getting 150 minutes of moderate intensity exercise weekly.  It is important that you improve your diet. Please limit carbohydrates in the form of white bread, rice, pasta, sweets, fast food, fried food, sugary drinks, etc. Increase your consumption of fresh fruits and vegetables, whole grains, lean protein.  Ensure you are consuming 64 ounces of water daily.  Please schedule a follow up appointment in 6 months for diabetes check.  It was a pleasure to see you today!

## 2019-05-15 NOTE — Assessment & Plan Note (Signed)
LDL above goal at 125 on prior labs. Repeat lipids pending.  Discussed to work on weight loss through diet and exercise. Consider statin therapy if no improvement after weight loss efforts.

## 2019-05-15 NOTE — Assessment & Plan Note (Signed)
Off of vitamin D for several weeks, repeat vitamin D level pending.

## 2019-11-14 ENCOUNTER — Ambulatory Visit: Payer: Managed Care, Other (non HMO) | Admitting: Primary Care

## 2019-11-15 ENCOUNTER — Encounter: Payer: Self-pay | Admitting: Primary Care

## 2019-11-15 ENCOUNTER — Other Ambulatory Visit: Payer: Self-pay

## 2019-11-15 ENCOUNTER — Ambulatory Visit: Payer: Managed Care, Other (non HMO) | Admitting: Primary Care

## 2019-11-15 VITALS — BP 122/82 | HR 82 | Temp 96.9°F | Ht 61.0 in | Wt 283.1 lb

## 2019-11-15 DIAGNOSIS — H6121 Impacted cerumen, right ear: Secondary | ICD-10-CM | POA: Diagnosis not present

## 2019-11-15 DIAGNOSIS — E119 Type 2 diabetes mellitus without complications: Secondary | ICD-10-CM | POA: Diagnosis not present

## 2019-11-15 DIAGNOSIS — H6120 Impacted cerumen, unspecified ear: Secondary | ICD-10-CM | POA: Insufficient documentation

## 2019-11-15 HISTORY — DX: Impacted cerumen, unspecified ear: H61.20

## 2019-11-15 LAB — POCT GLYCOSYLATED HEMOGLOBIN (HGB A1C): Hemoglobin A1C: 6.3 % — AB (ref 4.0–5.6)

## 2019-11-15 NOTE — Assessment & Plan Note (Signed)
Noted to right canal. Patient consented to removal. Removed firm dark wax with instrumentation without difficulty. Patient tolerated well.  Discussed use of Debrox drops.

## 2019-11-15 NOTE — Patient Instructions (Signed)
Resume Metformin XR 500 mg daily for blood sugars.  Start exercising. You should be getting 150 minutes of moderate intensity exercise weekly.  It is important that you improve your diet. Please limit carbohydrates in the form of white bread, rice, pasta, sweets, fast food, fried food, sugary drinks, etc. Increase your consumption of fresh fruits and vegetables, whole grains, lean protein.  Ensure you are consuming 64 ounces of water daily.  Please schedule a physical with me for 6 months.   It was a pleasure to see you today!

## 2019-11-15 NOTE — Progress Notes (Signed)
Subjective:    Tracy Henry ID: Tracy Henry, female    DOB: 07-24-1976, 44 y.o.   MRN: [id number]  HPI  This visit occurred during the SARS-CoV-2 public health emergency.  Safety protocols were in place, including screening questions prior to the visit, additional usage of staff PPE, and extensive cleaning of exam room while observing appropriate contact time as indicated for disinfecting solutions.   Tracy Henry is a 44 year old female with a history of type 2 diabetes, hyperlipidemia, obesity who presents today for follow up of diabetes. She's also noticed some ear fullness to the right ear for a few weeks. No pain. She does have chronic seasonal allergies.   Current medications include: Metformin XR 500 mg daily. She stopped taking her metformin around Christmas 2020.  She is checking her blood glucose 0 times daily.  Last A1C: 5.9 in August 2020, 6.3 today Last Eye Exam: Due, she will schedule.  Last Foot Exam: Due Pneumonia Vaccination: Never completed, declines  ACE/ARB: None. Negative urine micro in August 2020 Statin: None. LDL of 117 in August 2020   The 10-year ASCVD risk score Mikey Bussing DC Brooke Bonito., et al., 2013) is: 3.4%   Values used to calculate the score:     Age: 61 years     Sex: Female     Is Non-Hispanic African American: Yes     Diabetic: Yes     Tobacco smoker: No     Systolic Blood Pressure: 123XX123 mmHg     Is BP treated: No     HDL Cholesterol: 37.9 mg/dL     Total Cholesterol: 179 mg/dL  BP Readings from Last 3 Encounters:  11/15/19 122/82  05/15/19 134/84  03/22/19 136/78    Review of Systems  Eyes: Negative for visual disturbance.  Respiratory: Negative for shortness of breath.   Cardiovascular: Negative for chest pain.  Allergic/Immunologic: Positive for environmental allergies.  Neurological: Negative for numbness and headaches.       Past Medical History:  Diagnosis Date  . Environmental allergies   . Hyperlipidemia    diet controlled, no meds  . IDA  (iron deficiency anemia)   . Morbidly obese ([place])   . Type 2 diabetes mellitus ([place])    followed by pcp  . Uterine polyp    Removed in 2010  . Wears glasses      Social History   Socioeconomic History  . Marital status: Married    Spouse name: Not on file  . Number of children: Not on file  . Years of education: Not on file  . Highest education level: Not on file  Occupational History  . Not on file  Tobacco Use  . Smoking status: Never Smoker  . Smokeless tobacco: Never Used  Substance and Sexual Activity  . Alcohol use: No    Alcohol/week: 0.0 standard drinks  . Drug use: Never  . Sexual activity: Yes    Birth control/protection: None  Other Topics Concern  . Not on file  Social History Narrative   Married for 17 years    No children   Work for [employer] for 17 years   Enjoys reading   From [employer] Strain:   . Difficulty of Paying Living Expenses: Not on file  Food Insecurity:   . Worried About Charity fundraiser in the Last Year: Not on file  . Ran Out of Food in the Last Year: Not on file  Transportation Needs:   . Film/video editor (Medical): Not on file  . Lack of Transportation (Non-Medical): Not on file  Physical Activity:   . Days of Exercise per Week: Not on file  . Minutes of Exercise per Session: Not on file  Stress:   . Feeling of Stress : Not on file  Social Connections:   . Frequency of Communication with Friends and Family: Not on file  . Frequency of Social Gatherings with Friends and Family: Not on file  . Attends Religious Services: Not on file  . Active Member of Clubs or Organizations: Not on file  . Attends Archivist Meetings: Not on file  . Marital Status: Not on file  Intimate Partner Violence:   . Fear of Current or Ex-Partner: Not on file  . Emotionally Abused: Not on file  . Physically Abused: Not on file  . Sexually Abused: Not on file    Past Surgical  History:  Procedure Laterality Date  . DILATATION & CURRETTAGE/HYSTEROSCOPY WITH RESECTOCOPE N/A 04/02/2015   Procedure: DILATATION & CURETTAGE/HYSTEROSCOPY WITH RESECTOCOPE;  Surgeon: Brien Few, MD;  Location: [facility];  Service: Gynecology;  Laterality: N/A;  . HYSTEROSCOPY WITH NOVASURE N/A 03/22/2019   Procedure: HYSTEROSCOPY WITH POLYPECTOMY AND DILATATION AND CURRETTAGE;  Surgeon: Governor Specking, MD;  Location: [facility];  Service: Gynecology;  Laterality: N/A;  . HYSTEROSCOPY WITH RESECTOSCOPE  11-11-2009   dr Ronita Hipps @[facility]    polypectomy's  . LAPAROSCOPIC CHOLECYSTECTOMY  09-28-2008   dr b. Grandville Silos  @[facility]   . [place]  approx.    Family History  Problem Relation Age of Onset  . Hypertension Mother   . Hypertension Father   . Cancer Paternal Grandmother        Ovarian cancer, breast?  . Breast cancer Sister 70    No Known Allergies  Current Outpatient Medications on File Prior to Visit  Medication Sig Dispense Refill  . azelastine (ASTELIN) 0.1 % nasal spray Place 1 spray into both nostrils 2 (two) times daily. Use in each nostril as directed    . metFORMIN (GLUCOPHAGE-XR) 500 MG 24 hr tablet Take 1 tablet (500 mg total) by mouth daily with breakfast. For diabetes. 90 tablet 1   No current facility-administered medications on file prior to visit.    BP 122/82   Pulse 82   Temp (!) 96.9 F (36.1 C) (Temporal)   Ht 5\' 1"  (1.549 m)   Wt 283 lb 1 oz (128.4 kg)   LMP 10/29/2019   SpO2 98%   BMI 53.48 kg/m    Objective:   Physical Exam  Constitutional: She appears well-nourished.  HENT:  Right Ear: Tympanic membrane and ear canal normal.  Left Ear: Tympanic membrane and ear canal normal.  Right canal with cerumen impaction. TM and canal unremarkable post removal.   Cardiovascular: Normal rate and regular rhythm.  Respiratory: Effort normal and breath sounds normal.  Musculoskeletal:     Cervical back: Neck supple.    Skin: Skin is warm and dry.           Assessment & Plan:

## 2019-11-15 NOTE — Assessment & Plan Note (Signed)
A1C worse at 6.3 but still overall under control.  Given better numbers from last Summer while on metformin I recommended she resume Metformin to help preserve beta cell function within the pancrease. She agrees.  Resume Metformin. Repeat lipids and urine microalbumin next visit. Declines pneumonia vaccination.  Follow up in 6 months.

## 2019-12-23 ENCOUNTER — Ambulatory Visit: Payer: Managed Care, Other (non HMO) | Attending: Internal Medicine

## 2019-12-23 DIAGNOSIS — Z23 Encounter for immunization: Secondary | ICD-10-CM

## 2019-12-23 NOTE — Progress Notes (Signed)
   Covid-19 Vaccination Clinic  Name:  Tracy Henry    MRN: [id number] DOB: [birth year]  12/23/2019  [name] was observed post Covid-19 immunization for 15 minutes without incident. She was provided with Vaccine Information Sheet and instruction to access the V-Safe system.   [name] was instructed to call 911 with any severe reactions post vaccine: Marland Kitchen Difficulty breathing  . Swelling of face and throat  . A fast heartbeat  . A bad rash all over body  . Dizziness and weakness   Immunizations Administered    Name Date Dose VIS Date Route   Pfizer COVID-19 Vaccine 12/23/2019  8:44 AM 0.3 mL 08/25/2019 Intramuscular   Manufacturer: [place]   Lot: [id number]   [place]: [id number]

## 2020-01-16 ENCOUNTER — Ambulatory Visit: Payer: Managed Care, Other (non HMO) | Attending: Internal Medicine

## 2020-01-16 DIAGNOSIS — Z23 Encounter for immunization: Secondary | ICD-10-CM

## 2020-01-16 NOTE — Progress Notes (Signed)
   Covid-19 Vaccination Clinic  Name:  Tracy Henry    MRN: [id number] DOB: 05-Jan-1976  01/16/2020  [name] was observed post Covid-19 immunization for 15 minutes without incident. She was provided with Vaccine Information Sheet and instruction to access the V-Safe system.   [name] was instructed to call 911 with any severe reactions post vaccine: Marland Kitchen Difficulty breathing  . Swelling of face and throat  . A fast heartbeat  . A bad rash all over body  . Dizziness and weakness   Immunizations Administered    Name Date Dose VIS Date Route   Pfizer COVID-19 Vaccine 01/16/2020  2:04 PM 0.3 mL 11/08/2018 Intramuscular   Manufacturer: [place]   Lot: [id number]   [place]: [id number]

## 2020-05-05 ENCOUNTER — Other Ambulatory Visit: Payer: Self-pay | Admitting: Primary Care

## 2020-05-05 DIAGNOSIS — E785 Hyperlipidemia, unspecified: Secondary | ICD-10-CM

## 2020-05-05 DIAGNOSIS — R7303 Prediabetes: Secondary | ICD-10-CM

## 2020-05-05 DIAGNOSIS — E119 Type 2 diabetes mellitus without complications: Secondary | ICD-10-CM

## 2020-05-05 DIAGNOSIS — Z1159 Encounter for screening for other viral diseases: Secondary | ICD-10-CM

## 2020-05-05 DIAGNOSIS — E559 Vitamin D deficiency, unspecified: Secondary | ICD-10-CM

## 2020-05-05 DIAGNOSIS — Z114 Encounter for screening for human immunodeficiency virus [HIV]: Secondary | ICD-10-CM

## 2020-05-15 ENCOUNTER — Other Ambulatory Visit: Payer: Self-pay

## 2020-05-15 ENCOUNTER — Other Ambulatory Visit (INDEPENDENT_AMBULATORY_CARE_PROVIDER_SITE_OTHER): Payer: Managed Care, Other (non HMO)

## 2020-05-15 DIAGNOSIS — E785 Hyperlipidemia, unspecified: Secondary | ICD-10-CM

## 2020-05-15 DIAGNOSIS — Z114 Encounter for screening for human immunodeficiency virus [HIV]: Secondary | ICD-10-CM

## 2020-05-15 DIAGNOSIS — E119 Type 2 diabetes mellitus without complications: Secondary | ICD-10-CM | POA: Diagnosis not present

## 2020-05-15 DIAGNOSIS — E559 Vitamin D deficiency, unspecified: Secondary | ICD-10-CM | POA: Diagnosis not present

## 2020-05-15 DIAGNOSIS — Z1159 Encounter for screening for other viral diseases: Secondary | ICD-10-CM

## 2020-05-15 LAB — VITAMIN D 25 HYDROXY (VIT D DEFICIENCY, FRACTURES): VITD: 15.03 ng/mL — ABNORMAL LOW (ref 30.00–100.00)

## 2020-05-15 LAB — MICROALBUMIN / CREATININE URINE RATIO
Creatinine,U: 140.6 mg/dL
Microalb Creat Ratio: 3.1 mg/g (ref 0.0–30.0)
Microalb, Ur: 4.4 mg/dL — ABNORMAL HIGH (ref 0.0–1.9)

## 2020-05-15 LAB — COMPREHENSIVE METABOLIC PANEL
ALT: 16 U/L (ref 0–35)
AST: 14 U/L (ref 0–37)
Albumin: 3.9 g/dL (ref 3.5–5.2)
Alkaline Phosphatase: 77 U/L (ref 39–117)
BUN: 11 mg/dL (ref 6–23)
CO2: 26 mEq/L (ref 19–32)
Calcium: 8.8 mg/dL (ref 8.4–10.5)
Chloride: 105 mEq/L (ref 96–112)
Creatinine, Ser: 0.74 mg/dL (ref 0.40–1.20)
GFR: 102.63 mL/min (ref >60.00)
Glucose, Bld: 105 mg/dL — ABNORMAL HIGH (ref 70–99)
Potassium: 4 mEq/L (ref 3.5–5.1)
Sodium: 138 mEq/L (ref 135–145)
Total Bilirubin: 0.5 mg/dL (ref 0.2–1.2)
Total Protein: 7.1 g/dL (ref 6.0–8.3)

## 2020-05-15 LAB — LIPID PANEL
Cholesterol: 181 mg/dL (ref 0–200)
HDL: 31.2 mg/dL — ABNORMAL LOW (ref >39.00)
LDL Cholesterol: 116 mg/dL — ABNORMAL HIGH (ref 0–99)
NonHDL: 150.11
Total CHOL/HDL Ratio: 6
Triglycerides: 170 mg/dL — ABNORMAL HIGH (ref 0.0–149.0)
VLDL: 34 mg/dL (ref 0.0–40.0)

## 2020-05-15 LAB — CBC
HCT: 39.7 % (ref 36.0–46.0)
Hemoglobin: 13 g/dL (ref 12.0–15.0)
MCHC: 32.9 g/dL (ref 30.0–36.0)
MCV: 81 fl (ref 78.0–100.0)
Platelets: 325 10*3/uL (ref 150.0–400.0)
RBC: 4.9 Mil/uL (ref 3.87–5.11)
RDW: 15.3 % (ref 11.5–15.5)
WBC: 10.1 10*3/uL (ref 4.0–10.5)

## 2020-05-15 LAB — HEMOGLOBIN A1C: Hgb A1c MFr Bld: 6.4 % (ref 4.6–6.5)

## 2020-05-16 LAB — HEPATITIS C ANTIBODY
Hepatitis C Ab: NONREACTIVE
SIGNAL TO CUT-OFF: 0.02 (ref <1.00)

## 2020-05-16 LAB — HIV ANTIBODY (ROUTINE TESTING W REFLEX): HIV 1&2 Ab, 4th Generation: NONREACTIVE

## 2020-05-17 ENCOUNTER — Ambulatory Visit (INDEPENDENT_AMBULATORY_CARE_PROVIDER_SITE_OTHER): Payer: Managed Care, Other (non HMO) | Admitting: Primary Care

## 2020-05-17 ENCOUNTER — Other Ambulatory Visit: Payer: Self-pay

## 2020-05-17 ENCOUNTER — Encounter: Payer: Self-pay | Admitting: Primary Care

## 2020-05-17 VITALS — BP 124/84 | HR 94 | Ht 61.0 in | Wt 290.0 lb

## 2020-05-17 DIAGNOSIS — Z1211 Encounter for screening for malignant neoplasm of colon: Secondary | ICD-10-CM

## 2020-05-17 DIAGNOSIS — E785 Hyperlipidemia, unspecified: Secondary | ICD-10-CM | POA: Diagnosis not present

## 2020-05-17 DIAGNOSIS — K219 Gastro-esophageal reflux disease without esophagitis: Secondary | ICD-10-CM

## 2020-05-17 DIAGNOSIS — Z0000 Encounter for general adult medical examination without abnormal findings: Secondary | ICD-10-CM

## 2020-05-17 DIAGNOSIS — E119 Type 2 diabetes mellitus without complications: Secondary | ICD-10-CM

## 2020-05-17 MED ORDER — ROSUVASTATIN CALCIUM 5 MG PO TABS: 5.0000 mg | ORAL_TABLET | Freq: Every evening | ORAL | 3 refills | Status: DC

## 2020-05-17 NOTE — Patient Instructions (Addendum)
Start rosuvastatin (Crestor) 5 mg once every evening for cholesterol.  Start exercising. You should be getting 150 minutes of moderate intensity exercise weekly.  Continue to work on a healthy diet. Ensure you are consuming 64 ounces of water daily.  Schedule a lab appointment for 2 months to repeat your cholesterol.   Please schedule a follow up appointment in 6 months for diabetes check.   It was a pleasure to see you today!   Preventive Care 45-28 Years Old, Female Preventive care refers to visits with your health care provider and lifestyle choices that can promote health and wellness. This includes:  A yearly physical exam. This may also be called an annual well check.  Regular dental visits and eye exams.  Immunizations.  Screening for certain conditions.  Healthy lifestyle choices, such as eating a healthy diet, getting regular exercise, not using drugs or products that contain nicotine and tobacco, and limiting alcohol use. What can I expect for my preventive care visit? Physical exam Your health care provider will check your:  Height and weight. This may be used to calculate body mass index (BMI), which tells if you are at a healthy weight.  Heart rate and blood pressure.  Skin for abnormal spots. Counseling Your health care provider may ask you questions about your:  Alcohol, tobacco, and drug use.  Emotional well-being.  Home and relationship well-being.  Sexual activity.  Eating habits.  Work and work Statistician.  Method of birth control.  Menstrual cycle.  Pregnancy history. What immunizations do I need?  Influenza (flu) vaccine  This is recommended every year. Tetanus, diphtheria, and pertussis (Tdap) vaccine  You may need a Td booster every 10 years. Varicella (chickenpox) vaccine  You may need this if you have not been vaccinated. Zoster (shingles) vaccine  You may need this after age 45. Measles, mumps, and rubella (MMR)  vaccine  You may need at least one dose of MMR if you were born in 1957 or later. You may also need a second dose. Pneumococcal conjugate (PCV13) vaccine  You may need this if you have certain conditions and were not previously vaccinated. Pneumococcal polysaccharide (PPSV23) vaccine  You may need one or two doses if you smoke cigarettes or if you have certain conditions. Meningococcal conjugate (MenACWY) vaccine  You may need this if you have certain conditions. Hepatitis A vaccine  You may need this if you have certain conditions or if you travel or work in places where you may be exposed to hepatitis A. Hepatitis B vaccine  You may need this if you have certain conditions or if you travel or work in places where you may be exposed to hepatitis B. Haemophilus influenzae type b (Hib) vaccine  You may need this if you have certain conditions. Human papillomavirus (HPV) vaccine  If recommended by your health care provider, you may need three doses over 6 months. You may receive vaccines as individual doses or as more than one vaccine together in one shot (combination vaccines). Talk with your health care provider about the risks and benefits of combination vaccines. What tests do I need? Blood tests  Lipid and cholesterol levels. These may be checked every 5 years, or more frequently if you are over 74 years old.  Hepatitis C test.  Hepatitis B test. Screening  Lung cancer screening. You may have this screening every year starting at age 45 if you have a 30-pack-year history of smoking and currently smoke or have quit within the past  15 years.  Colorectal cancer screening. All adults should have this screening starting at age 45 and continuing until age 7. Your health care provider may recommend screening at age 16 if you are at increased risk. You will have tests every 1-10 years, depending on your results and the type of screening test.  Diabetes screening. This is done by  checking your blood sugar (glucose) after you have not eaten for a while (fasting). You may have this done every 1-3 years.  Mammogram. This may be done every 1-2 years. Talk with your health care provider about when you should start having regular mammograms. This may depend on whether you have a family history of breast cancer.  BRCA-related cancer screening. This may be done if you have a family history of breast, ovarian, tubal, or peritoneal cancers.  Pelvic exam and Pap test. This may be done every 3 years starting at age 45. Starting at age 65, this may be done every 5 years if you have a Pap test in combination with an HPV test. Other tests  Sexually transmitted disease (STD) testing.  Bone density scan. This is done to screen for osteoporosis. You may have this scan if you are at high risk for osteoporosis. Follow these instructions at home: Eating and drinking  Eat a diet that includes fresh fruits and vegetables, whole grains, lean protein, and low-fat dairy.  Take vitamin and mineral supplements as recommended by your health care provider.  Do not drink alcohol if: ? Your health care provider tells you not to drink. ? You are pregnant, may be pregnant, or are planning to become pregnant.  If you drink alcohol: ? Limit how much you have to 0-1 drink a day. ? Be aware of how much alcohol is in your drink. In the [place] one drink equals one 12 oz bottle of beer (355 mL), one 5 oz glass of wine (148 mL), or one 1 oz glass of hard liquor (44 mL). Lifestyle  Take daily care of your teeth and gums.  Stay active. Exercise for at least 30 minutes on 5 or more days each week.  Do not use any products that contain nicotine or tobacco, such as cigarettes, e-cigarettes, and chewing tobacco. If you need help quitting, ask your health care provider.  If you are sexually active, practice safe sex. Use a condom or other form of birth control (contraception) in order to prevent pregnancy  and STIs (sexually transmitted infections).  If told by your health care provider, take low-dose aspirin daily starting at age 45. What's next?  Visit your health care provider once a year for a well check visit.  Ask your health care provider how often you should have your eyes and teeth checked.  Stay up to date on all vaccines. This information is not intended to replace advice given to you by your health care provider. Make sure you discuss any questions you have with your health care provider. Document Revised: 05/12/2018 Document Reviewed: 05/12/2018 Elsevier Patient Education  2020 [employer].

## 2020-05-17 NOTE — Assessment & Plan Note (Signed)
Declines influenza and Pneumonia vaccines. Mammogram due, follows with GYN. Pap smear due, follows with GYN. Colonoscopy due, referral placed to GI.  Discussed the importance of a healthy diet and regular exercise in order for weight loss, and to reduce the risk of any potential medical problems.  Exam today unremarkable. Labs reviewed.

## 2020-05-17 NOTE — Assessment & Plan Note (Signed)
LDL above goal, has been above goal for years. Given her diabetes history we will proceed with statin therapy for prevention against CVD.  Rx for rosuvastatin 5 mg sent to pharmacy.  Repeat lipids and LFT's in 2 months.

## 2020-05-17 NOTE — Assessment & Plan Note (Signed)
Recent A1C of 6.4, has not taken metformin in months.  Continue off metformin as she does not wish to take.  Foot exam today. Eye exam scheduled. Declines pneumonia vaccination. Urine micro UTD.  Discussed to start with regular exercise, work on diet. Follow up in 6 months.

## 2020-05-17 NOTE — Progress Notes (Signed)
Subjective:    Patient ID: Tracy Henry, female    DOB: February 17, 1976, 44 y.o.   MRN: [id number]  HPI  This visit occurred during the SARS-CoV-2 public health emergency.  Safety protocols were in place, including screening questions prior to the visit, additional usage of staff PPE, and extensive cleaning of exam room while observing appropriate contact time as indicated for disinfecting solutions.   Tracy Henry is a 44 year old female who presents today for complete physical.  Immunizations: -Tetanus: Completed in 2015 -Influenza: Declines -Pneumonia: Never completed, declines  -Covid-19: Completed series  Diet: She endorses a fair diet. Exercise: She just joined a gym.  Eye exam: Scheduled for December 2021 Dental exam: Completes semi-annually   Pap Smear: Completed in 2020, follows with GYN Mammogram: Due, scheduled for October 2021 Colonoscopy: Never completed   BP Readings from Last 3 Encounters:  05/17/20 124/84  11/15/19 122/82  05/15/19 134/84   The 10-year ASCVD risk score Mikey Bussing DC Jr., et al., 2013) is: 5.8%   Values used to calculate the score:     Age: 8 years     Sex: Female     Is Non-Hispanic African American: Yes     Diabetic: Yes     Tobacco smoker: No     Systolic Blood Pressure: 149 mmHg     Is BP treated: No     HDL Cholesterol: 31.2 mg/dL     Total Cholesterol: 181 mg/dL   Review of Systems  Constitutional: Negative for unexpected weight change.  HENT: Negative for rhinorrhea.   Respiratory: Negative for shortness of breath.   Cardiovascular: Negative for chest pain.  Gastrointestinal: Negative for constipation and diarrhea.  Genitourinary: Negative for difficulty urinating and menstrual problem.  Musculoskeletal: Negative for arthralgias and myalgias.  Skin: Negative for rash.  Allergic/Immunologic: Positive for environmental allergies.  Neurological: Negative for dizziness, numbness and headaches.  Psychiatric/Behavioral: The patient is not  nervous/anxious.        Past Medical History:  Diagnosis Date  . Environmental allergies   . Hyperlipidemia    diet controlled, no meds  . IDA (iron deficiency anemia)   . Morbidly obese ([place])   . Type 2 diabetes mellitus ([place])    followed by pcp  . Uterine polyp    Removed in 2010  . Wears glasses      Social History   Socioeconomic History  . Marital status: Married    Spouse name: Not on file  . Number of children: Not on file  . Years of education: Not on file  . Highest education level: Not on file  Occupational History  . Not on file  Tobacco Use  . Smoking status: Never Smoker  . Smokeless tobacco: Never Used  Vaping Use  . Vaping Use: Never used  Substance and Sexual Activity  . Alcohol use: No    Alcohol/week: 0.0 standard drinks  . Drug use: Never  . Sexual activity: Yes    Birth control/protection: None  Other Topics Concern  . Not on file  Social History Narrative   Married for 17 years    No children   Work for [employer] for 17 years   Enjoys reading   From [employer] Strain:   . Difficulty of Paying Living Expenses: Not on file  Food Insecurity:   . Worried About Charity fundraiser in the Last Year: Not on file  . Ran Out of  Food in the Last Year: Not on file  Transportation Needs:   . Lack of Transportation (Medical): Not on file  . Lack of Transportation (Non-Medical): Not on file  Physical Activity:   . Days of Exercise per Week: Not on file  . Minutes of Exercise per Session: Not on file  Stress:   . Feeling of Stress : Not on file  Social Connections:   . Frequency of Communication with Friends and Family: Not on file  . Frequency of Social Gatherings with Friends and Family: Not on file  . Attends Religious Services: Not on file  . Active Member of Clubs or Organizations: Not on file  . Attends Archivist Meetings: Not on file  . Marital Status: Not on file  Intimate  Partner Violence:   . Fear of Current or Ex-Partner: Not on file  . Emotionally Abused: Not on file  . Physically Abused: Not on file  . Sexually Abused: Not on file    Past Surgical History:  Procedure Laterality Date  . DILATATION & CURRETTAGE/HYSTEROSCOPY WITH RESECTOCOPE N/A 04/02/2015   Procedure: DILATATION & CURETTAGE/HYSTEROSCOPY WITH RESECTOCOPE;  Surgeon: Brien Few, MD;  Location: [facility];  Service: Gynecology;  Laterality: N/A;  . HYSTEROSCOPY WITH NOVASURE N/A 03/22/2019   Procedure: HYSTEROSCOPY WITH POLYPECTOMY AND DILATATION AND CURRETTAGE;  Surgeon: Governor Specking, MD;  Location: [facility];  Service: Gynecology;  Laterality: N/A;  . HYSTEROSCOPY WITH RESECTOSCOPE  11-11-2009   dr Ronita Hipps @[facility]    polypectomy's  . LAPAROSCOPIC CHOLECYSTECTOMY  09-28-2008   dr b. Grandville Silos  @[facility]   . [place]  approx.    Family History  Problem Relation Age of Onset  . Hypertension Mother   . Hypertension Father   . Cancer Paternal Grandmother        Ovarian cancer, breast?  . Breast cancer Sister 70    No Known Allergies  Current Outpatient Medications on File Prior to Visit  Medication Sig Dispense Refill  . metFORMIN (GLUCOPHAGE-XR) 500 MG 24 hr tablet Take 1 tablet (500 mg total) by mouth daily with breakfast. For diabetes. 90 tablet 1   No current facility-administered medications on file prior to visit.    BP 124/84   Pulse 94   Ht 5\' 1"  (1.549 m)   Wt 290 lb (131.5 kg)   LMP 04/23/2020   SpO2 96%   BMI 54.80 kg/m    Objective:   Physical Exam HENT:     Right Ear: Tympanic membrane and ear canal normal.     Left Ear: Tympanic membrane and ear canal normal.  Eyes:     Pupils: Pupils are equal, round, and reactive to light.  Cardiovascular:     Rate and Rhythm: Normal rate and regular rhythm.  Pulmonary:     Effort: Pulmonary effort is normal.     Breath sounds: Normal breath sounds.  Abdominal:     General: Bowel  sounds are normal.     Palpations: Abdomen is soft.     Tenderness: There is no abdominal tenderness.  Musculoskeletal:        General: Normal range of motion.     Cervical back: Neck supple.  Skin:    General: Skin is warm and dry.  Neurological:     Mental Status: She is alert and oriented to person, place, and time.     Cranial Nerves: No cranial nerve deficit.     Deep Tendon Reflexes:     Reflex  Scores:      Patellar reflexes are 2+ on the right side and 2+ on the left side. Psychiatric:        Mood and Affect: Mood normal.            Assessment & Plan:

## 2020-05-17 NOTE — Assessment & Plan Note (Signed)
No concerns, continue to monitor.

## 2020-05-27 ENCOUNTER — Other Ambulatory Visit: Payer: Self-pay

## 2020-05-27 ENCOUNTER — Telehealth (INDEPENDENT_AMBULATORY_CARE_PROVIDER_SITE_OTHER): Payer: Self-pay | Admitting: Gastroenterology

## 2020-05-27 DIAGNOSIS — Z1211 Encounter for screening for malignant neoplasm of colon: Secondary | ICD-10-CM

## 2020-05-27 MED ORDER — NA SULFATE-K SULFATE-MG SULF 17.5-3.13-1.6 GM/177ML PO SOLN: 1.0000 | Freq: Once | ORAL | 0 refills | Status: AC

## 2020-05-27 NOTE — Progress Notes (Signed)
Gastroenterology Pre-Procedure Review  Request Date: Friday 06/21/20 Requesting Physician: Dr. Bonna Gains  PATIENT REVIEW QUESTIONS: The patient responded to the following health history questions as indicated:    1. Are you having any GI issues? no 2. Do you have a personal history of Polyps? no 3. Do you have a family history of Colon Cancer or Polyps? no 4. Diabetes Mellitus? no 5. Joint replacements in the past 12 months?no 6. Major health problems in the past 3 months?no 7. Any artificial heart valves, MVP, or defibrillator?no    MEDICATIONS & ALLERGIES:    Patient reports the following regarding taking any anticoagulation/antiplatelet therapy:   Plavix, Coumadin, Eliquis, Xarelto, Lovenox, Pradaxa, Brilinta, or Effient? no Aspirin? no  Patient confirms/reports the following medications:  Current Outpatient Medications  Medication Sig Dispense Refill  . rosuvastatin (CRESTOR) 5 MG tablet Take 1 tablet (5 mg total) by mouth every evening. For cholesterol. 90 tablet 3  . metFORMIN (GLUCOPHAGE-XR) 500 MG 24 hr tablet Take 1 tablet (500 mg total) by mouth daily with breakfast. For diabetes. (Patient not taking: Reported on 05/27/2020) 90 tablet 1  . Na Sulfate-K Sulfate-Mg Sulf 17.5-3.13-1.6 GM/177ML SOLN Take 1 kit by mouth once for 1 dose. 354 mL 0   No current facility-administered medications for this visit.    Patient confirms/reports the following allergies:  No Known Allergies  Orders Placed This Encounter  Procedures  . Procedural/ Surgical Case Request: COLONOSCOPY WITH PROPOFOL    Standing Status:   Standing    Number of Occurrences:   1    Order Specific Question:   Pre-op diagnosis    Answer:   screeniing colonoscopy    Order Specific Question:   CPT Code    Answer:   29528    AUTHORIZATION INFORMATION Primary Insurance: 1D#: Group #:  Secondary Insurance: 1D#: Group #:  SCHEDULE INFORMATION: Date: Friday 06/21/20 Time: Location:[facility]

## 2020-06-19 ENCOUNTER — Other Ambulatory Visit
Admission: RE | Admit: 2020-06-19 | Discharge: 2020-06-19 | Disposition: A | Discharge status: Home / Self Care | Payer: Managed Care, Other (non HMO) | Source: Ambulatory Visit | Attending: Gastroenterology | Admitting: Gastroenterology

## 2020-06-19 ENCOUNTER — Other Ambulatory Visit: Payer: Self-pay

## 2020-06-19 DIAGNOSIS — Z01818 Encounter for other preprocedural examination: Secondary | ICD-10-CM | POA: Insufficient documentation

## 2020-06-19 DIAGNOSIS — Z20822 Contact with and (suspected) exposure to covid-19: Secondary | ICD-10-CM | POA: Diagnosis not present

## 2020-06-19 LAB — SARS CORONAVIRUS 2 (TAT 6-24 HRS): SARS Coronavirus 2: NEGATIVE

## 2020-06-21 ENCOUNTER — Ambulatory Visit
Admission: RE | Admit: 2020-06-21 | Discharge: 2020-06-21 | Disposition: A | Discharge status: Home / Self Care | Payer: Managed Care, Other (non HMO) | Attending: Gastroenterology | Admitting: Gastroenterology

## 2020-06-21 ENCOUNTER — Ambulatory Visit: Payer: Managed Care, Other (non HMO) | Admitting: Certified Registered Nurse Anesthetist

## 2020-06-21 ENCOUNTER — Encounter: Payer: Self-pay | Admitting: Gastroenterology

## 2020-06-21 ENCOUNTER — Encounter
Admission: RE | Disposition: A | Discharge status: Home / Self Care | Payer: Self-pay | Source: Home / Self Care | Attending: Gastroenterology

## 2020-06-21 DIAGNOSIS — D125 Benign neoplasm of sigmoid colon: Secondary | ICD-10-CM | POA: Diagnosis not present

## 2020-06-21 DIAGNOSIS — E119 Type 2 diabetes mellitus without complications: Secondary | ICD-10-CM | POA: Diagnosis not present

## 2020-06-21 DIAGNOSIS — Z1211 Encounter for screening for malignant neoplasm of colon: Secondary | ICD-10-CM | POA: Diagnosis present

## 2020-06-21 DIAGNOSIS — Z6841 Body Mass Index (BMI) 40.0 and over, adult: Secondary | ICD-10-CM | POA: Diagnosis not present

## 2020-06-21 DIAGNOSIS — Z79899 Other long term (current) drug therapy: Secondary | ICD-10-CM | POA: Insufficient documentation

## 2020-06-21 DIAGNOSIS — K5730 Diverticulosis of large intestine without perforation or abscess without bleeding: Secondary | ICD-10-CM | POA: Diagnosis not present

## 2020-06-21 DIAGNOSIS — E785 Hyperlipidemia, unspecified: Secondary | ICD-10-CM | POA: Insufficient documentation

## 2020-06-21 DIAGNOSIS — Z7984 Long term (current) use of oral hypoglycemic drugs: Secondary | ICD-10-CM | POA: Diagnosis not present

## 2020-06-21 DIAGNOSIS — K635 Polyp of colon: Secondary | ICD-10-CM | POA: Diagnosis not present

## 2020-06-21 HISTORY — PX: COLONOSCOPY WITH PROPOFOL: SHX5780

## 2020-06-21 LAB — GLUCOSE, CAPILLARY: Glucose-Capillary: 114 mg/dL — ABNORMAL HIGH (ref 70–99)

## 2020-06-21 SURGERY — COLONOSCOPY WITH PROPOFOL
Anesthesia: General

## 2020-06-21 MED ORDER — EPHEDRINE 5 MG/ML INJ
INTRAVENOUS | Status: AC
  Filled 2020-06-21: qty 10

## 2020-06-21 MED ORDER — LIDOCAINE HCL (CARDIAC) PF 100 MG/5ML IV SOSY
PREFILLED_SYRINGE | INTRAVENOUS | Status: DC | PRN
  Administered 2020-06-21: 50 mg via INTRAVENOUS

## 2020-06-21 MED ORDER — PROPOFOL 500 MG/50ML IV EMUL
INTRAVENOUS | Status: AC
  Filled 2020-06-21: qty 50

## 2020-06-21 MED ORDER — PROPOFOL 10 MG/ML IV BOLUS
INTRAVENOUS | Status: DC | PRN
  Administered 2020-06-21: 60 mg via INTRAVENOUS
  Administered 2020-06-21: 20 mg via INTRAVENOUS

## 2020-06-21 MED ORDER — LIDOCAINE HCL (PF) 2 % IJ SOLN
INTRAMUSCULAR | Status: AC
  Filled 2020-06-21: qty 5

## 2020-06-21 MED ORDER — SODIUM CHLORIDE 0.9 % IV SOLN
INTRAVENOUS | Status: DC
  Administered 2020-06-21: 20 mL/h via INTRAVENOUS

## 2020-06-21 MED ORDER — EPHEDRINE SULFATE 50 MG/ML IJ SOLN
INTRAMUSCULAR | Status: DC | PRN
  Administered 2020-06-21: 5 mg via INTRAVENOUS

## 2020-06-21 MED ORDER — PROPOFOL 500 MG/50ML IV EMUL
INTRAVENOUS | Status: DC | PRN
  Administered 2020-06-21: 150 ug/kg/min via INTRAVENOUS

## 2020-06-21 NOTE — H&P (Signed)
Tracy Antigua, MD [address], [place], [place], [place], [address] [address], [place], [place], [place], [address] Phone: [phone]  Fax: [phone]  Primary Care Physician:  Pleas Koch, NP   Pre-Procedure History & Physical: HPI:  Tracy Henry is a 44 y.o. female is here for a colonoscopy.   Past Medical History:  Diagnosis Date  . Environmental allergies   . Hyperlipidemia    diet controlled, no meds  . IDA (iron deficiency anemia)   . Morbidly obese ([place])   . Type 2 diabetes mellitus ([place])    followed by pcp  . Uterine polyp    Removed in 2010  . Wears glasses     Past Surgical History:  Procedure Laterality Date  . DILATATION & CURRETTAGE/HYSTEROSCOPY WITH RESECTOCOPE N/A 04/02/2015   Procedure: DILATATION & CURETTAGE/HYSTEROSCOPY WITH RESECTOCOPE;  Surgeon: Brien Few, MD;  Location: [facility];  Service: Gynecology;  Laterality: N/A;  . HYSTEROSCOPY WITH NOVASURE N/A 03/22/2019   Procedure: HYSTEROSCOPY WITH POLYPECTOMY AND DILATATION AND CURRETTAGE;  Surgeon: Governor Specking, MD;  Location: [facility];  Service: Gynecology;  Laterality: N/A;  . HYSTEROSCOPY WITH RESECTOSCOPE  11-11-2009   dr Ronita Hipps @[facility]    polypectomy's  . LAPAROSCOPIC CHOLECYSTECTOMY  09-28-2008   dr b. Grandville Silos  @[facility]   . TONSILLECTOMY AND ADENOIDECTOMY  1984  approx.    Prior to Admission medications   Medication Sig Start Date End Date Taking? Authorizing Provider  metFORMIN (GLUCOPHAGE-XR) 500 MG 24 hr tablet Take 1 tablet (500 mg total) by mouth daily with breakfast. For diabetes. 02/10/19  Yes Pleas Koch, NP  rosuvastatin (CRESTOR) 5 MG tablet Take 1 tablet (5 mg total) by mouth every evening. For cholesterol. 05/17/20  Yes Pleas Koch, NP    Allergies as of 05/27/2020  . (No Known Allergies)    Family History  Problem Relation Age of Onset  . Hypertension Mother   . Hypertension Father   . Cancer Paternal Grandmother        Ovarian  cancer, breast?  . Breast cancer Sister 3    Social History   Socioeconomic History  . Marital status: Married    Spouse name: Not on file  . Number of children: Not on file  . Years of education: Not on file  . Highest education level: Not on file  Occupational History  . Not on file  Tobacco Use  . Smoking status: Never Smoker  . Smokeless tobacco: Never Used  Vaping Use  . Vaping Use: Never used  Substance and Sexual Activity  . Alcohol use: No    Alcohol/week: 0.0 standard drinks  . Drug use: Never  . Sexual activity: Yes    Birth control/protection: None  Other Topics Concern  . Not on file  Social History Narrative   Married for 17 years    No children   Work for [employer] for 17 years   Enjoys reading   From [employer] Strain:   . Difficulty of Paying Living Expenses: Not on file  Food Insecurity:   . Worried About Charity fundraiser in the Last Year: Not on file  . Ran Out of Food in the Last Year: Not on file  Transportation Needs:   . Lack of Transportation (Medical): Not on file  . Lack of Transportation (Non-Medical): Not on file  Physical Activity:   . Days of Exercise per Week: Not on file  .  Minutes of Exercise per Session: Not on file  Stress:   . Feeling of Stress : Not on file  Social Connections:   . Frequency of Communication with Friends and Family: Not on file  . Frequency of Social Gatherings with Friends and Family: Not on file  . Attends Religious Services: Not on file  . Active Member of Clubs or Organizations: Not on file  . Attends Archivist Meetings: Not on file  . Marital Status: Not on file  Intimate Partner Violence:   . Fear of Current or Ex-Partner: Not on file  . Emotionally Abused: Not on file  . Physically Abused: Not on file  . Sexually Abused: Not on file    Review of Systems: See HPI, otherwise negative ROS  Physical Exam: BP 139/88   Pulse 98    Temp 97.9 F (36.6 C) (Temporal)   Resp 20   Ht 5\' 1"  (1.549 m)   Wt 128.4 kg   SpO2 98%   BMI 53.47 kg/m  General:   Alert,  pleasant and cooperative in NAD Head:  Normocephalic and atraumatic. Neck:  Supple; no masses or thyromegaly. Lungs:  Clear throughout to auscultation, normal respiratory effort.    Heart:  +S1, +S2, Regular rate and rhythm, No edema. Abdomen:  Soft, nontender and nondistended. Normal bowel sounds, without guarding, and without rebound.   Neurologic:  Alert and  oriented x4;  grossly normal neurologically.  Impression/Plan: [name] is here for a colonoscopy to be performed for average risk screening.  Risks, benefits, limitations, and alternatives regarding  colonoscopy have been reviewed with the patient.  Questions have been answered.  All parties agreeable.   Virgel Manifold, MD  06/21/2020, 9:05 AM

## 2020-06-21 NOTE — Transfer of Care (Signed)
Immediate Anesthesia Transfer of Care Note  Patient: Tracy Henry  Procedure(s) Performed: COLONOSCOPY WITH PROPOFOL (N/A )  Patient Location: Endoscopy Unit  Anesthesia Type:General  Level of Consciousness: awake, alert  and oriented  Airway & Oxygen Therapy: Patient Spontanous Breathing and Patient connected to face mask oxygen  Post-op Assessment: Report given to RN and Post -op Vital signs reviewed and stable  Post vital signs: Reviewed and stable  Last Vitals:  Vitals Value Taken Time  BP 96/48 06/21/20 0949  Temp 36.6 C 06/21/20 0949  Pulse 102 06/21/20 0950  Resp 24 06/21/20 0950  SpO2 99 % 06/21/20 0950  Vitals shown include unvalidated device data.  Last Pain:  Vitals:   06/21/20 0949  TempSrc:   PainSc: 0-No pain         Complications: No complications documented.

## 2020-06-21 NOTE — Anesthesia Preprocedure Evaluation (Signed)
Anesthesia Evaluation  Patient identified by MRN, date of birth, ID band Patient awake    Reviewed: Allergy & Precautions, H&P , NPO status , Patient's Chart, lab work & pertinent test results, reviewed documented beta blocker date and time   History of Anesthesia Complications Negative for: history of anesthetic complications  Airway Mallampati: II  TM Distance: >3 FB Neck ROM: full    Dental no notable dental hx.    Pulmonary neg pulmonary ROS,    Pulmonary exam normal breath sounds clear to auscultation       Cardiovascular Exercise Tolerance: Good negative cardio ROS Normal cardiovascular exam Rhythm:regular Rate:Normal     Neuro/Psych negative neurological ROS  negative psych ROS   GI/Hepatic Neg liver ROS, GERD  ,  Endo/Other  diabetesMorbid obesity  Renal/GU negative Renal ROS  negative genitourinary   Musculoskeletal   Abdominal   Peds  Hematology  (+) Blood dyscrasia, anemia ,   Anesthesia Other Findings Past Medical History: No date: Environmental allergies No date: Hyperlipidemia     Comment:  diet controlled, no meds No date: IDA (iron deficiency anemia) No date: Morbidly obese ([facility]) No date: Type 2 diabetes mellitus ([place])     Comment:  followed by pcp No date: Uterine polyp     Comment:  Removed in 2010 No date: Wears glasses   Reproductive/Obstetrics negative OB ROS                             Anesthesia Physical Anesthesia Plan  ASA: III  Anesthesia Plan: General   Post-op Pain Management:    Induction: Intravenous  PONV Risk Score and Plan: 3 and Propofol infusion and TIVA  Airway Management Planned: Natural Airway and Nasal Cannula  Additional Equipment:   Intra-op Plan:   Post-operative Plan:   Informed Consent: I have reviewed the patients History and Physical, chart, labs and discussed the procedure including the risks, benefits and  alternatives for the proposed anesthesia with the patient or authorized representative who has indicated his/her understanding and acceptance.     Dental Advisory Given  Plan Discussed with: Anesthesiologist, CRNA and Surgeon  Anesthesia Plan Comments:         Anesthesia Quick Evaluation

## 2020-06-21 NOTE — Op Note (Signed)
[facility] Patient Name: Tracy Henry Procedure Date: 06/21/2020 9:03 AM MRN: [id number] Account #: [id number] Date of Birth: [birth year] Admit Type: Outpatient Age: 44 Room: [facility] ENDO ROOM 4 Gender: Female Note Status: Finalized Procedure:             Colonoscopy Indications:           Screening for colorectal malignant neoplasm Providers:             Kaydynce Pat B. Bonna Gains MD, MD Referring MD:          Pleas Koch (Referring MD) Medicines:             Monitored Anesthesia Care Complications:         No immediate complications. Procedure:             Pre-Anesthesia Assessment:                        - ASA Grade Assessment: II - A patient with mild                         systemic disease.                        - Prior to the procedure, a History and Physical was                         performed, and patient medications, allergies and                         sensitivities were reviewed. The patient's tolerance                         of previous anesthesia was reviewed.                        - The risks and benefits of the procedure and the                         sedation options and risks were discussed with the                         patient. All questions were answered and informed                         consent was obtained.                        - Patient identification and proposed procedure were                         verified prior to the procedure by the physician, the                         nurse, the anesthesiologist, the anesthetist and the                         technician. The procedure was verified in the                         procedure room.  After obtaining informed consent, the colonoscope was                         passed under direct vision. Throughout the procedure,                         the patient's blood pressure, pulse, and oxygen                         saturations were monitored  continuously. The                         Colonoscope was introduced through the anus and                         advanced to the the cecum, identified by appendiceal                         orifice and ileocecal valve. The colonoscopy was                         performed with ease. The patient tolerated the                         procedure well. The quality of the bowel preparation                         was good. Findings:      The perianal and digital rectal examinations were normal.      Three pedunculated and sessile polyps were found in the sigmoid colon.       The polyps were 4 to 10 mm in size. These polyps were removed with a       cold snare. Resection and retrieval were complete. These polyps were       removed with a hot snare. Resection and retrieval were complete. One of       the 3 polyps was pedunculated and was removed via hot snare and       retrieved via roth net. The other two were sessile and removed via cold       snare.      Multiple diverticula were found in the sigmoid colon.      The exam was otherwise without abnormality.      The rectum, sigmoid colon, descending colon, transverse colon, ascending       colon and cecum appeared normal.      The retroflexed view of the distal rectum and anal verge was normal and       showed no anal or rectal abnormalities. Impression:            - Three 4 to 10 mm polyps in the sigmoid colon,                         removed with a cold snare and removed with a hot                         snare. Resected and retrieved.                        - Diverticulosis  in the sigmoid colon.                        - The examination was otherwise normal.                        - The rectum, sigmoid colon, descending colon,                         transverse colon, ascending colon and cecum are normal.                        - The distal rectum and anal verge are normal on                         retroflexion view. Recommendation:         - Discharge patient to home (with escort).                        - High fiber diet.                        - Advance diet as tolerated.                        - Continue present medications.                        - Await pathology results.                        - Repeat colonoscopy in 3 years.                        - The findings and recommendations were discussed with                         the patient.                        - The findings and recommendations were discussed with                         the patient's family.                        - Return to primary care physician as previously                         scheduled. Procedure Code(s):     --- Professional ---                        [phone], Colonoscopy, flexible; with removal of                         tumor(s), polyp(s), or other lesion(s) by snare                         technique Diagnosis Code(s):     --- Professional ---  Z12.11, Encounter for screening for malignant neoplasm                         of colon                        K63.5, Polyp of colon CPT copyright 2019 American Medical Association. All rights reserved. The codes documented in this report are preliminary and upon coder review may  be revised to meet current compliance requirements.  [name], MD Margretta Sidle B. Bonna Gains MD, MD 06/21/2020 9:51:51 AM This report has been signed electronically. Number of Addenda: 0 Note Initiated On: 06/21/2020 9:03 AM Scope Withdrawal Time: 0 hours 22 minutes 22 seconds  Total Procedure Duration: 0 hours 30 minutes 22 seconds  Estimated Blood Loss:  Estimated blood loss: none.      [facility]

## 2020-06-22 NOTE — Anesthesia Postprocedure Evaluation (Signed)
Anesthesia Post Note  Patient: Tracy Henry  Procedure(s) Performed: COLONOSCOPY WITH PROPOFOL (N/A )  Patient location during evaluation: Endoscopy Anesthesia Type: General Level of consciousness: awake and alert Pain management: pain level controlled Vital Signs Assessment: post-procedure vital signs reviewed and stable Respiratory status: spontaneous breathing, nonlabored ventilation, respiratory function stable and patient connected to nasal cannula oxygen Cardiovascular status: blood pressure returned to baseline and stable Postop Assessment: no apparent nausea or vomiting Anesthetic complications: no   No complications documented.   Last Vitals:  Vitals:   06/21/20 0959 06/21/20 1009  BP: 112/75 127/72  Pulse: 91 91  Resp: (!) 30 17  Temp:    SpO2: 99% 98%    Last Pain:  Vitals:   06/21/20 1009  TempSrc:   PainSc: 0-No pain                 Martha Clan

## 2020-06-24 ENCOUNTER — Encounter: Payer: Self-pay | Admitting: Gastroenterology

## 2020-06-24 LAB — SURGICAL PATHOLOGY

## 2020-06-26 LAB — HM MAMMOGRAPHY

## 2020-06-27 ENCOUNTER — Encounter: Payer: Self-pay | Admitting: Gastroenterology

## 2020-07-03 ENCOUNTER — Encounter: Payer: Self-pay | Admitting: Primary Care

## 2020-07-11 ENCOUNTER — Other Ambulatory Visit: Payer: Self-pay | Admitting: Primary Care

## 2020-07-11 DIAGNOSIS — E785 Hyperlipidemia, unspecified: Secondary | ICD-10-CM

## 2020-07-17 ENCOUNTER — Other Ambulatory Visit: Payer: Self-pay

## 2020-07-17 ENCOUNTER — Other Ambulatory Visit (INDEPENDENT_AMBULATORY_CARE_PROVIDER_SITE_OTHER): Payer: Managed Care, Other (non HMO)

## 2020-07-17 DIAGNOSIS — E785 Hyperlipidemia, unspecified: Secondary | ICD-10-CM | POA: Diagnosis not present

## 2020-07-17 LAB — HEPATIC FUNCTION PANEL
ALT: 21 U/L (ref 0–35)
AST: 18 U/L (ref 0–37)
Albumin: 4 g/dL (ref 3.5–5.2)
Alkaline Phosphatase: 75 U/L (ref 39–117)
Bilirubin, Direct: 0 mg/dL (ref 0.0–0.3)
Total Bilirubin: 0.6 mg/dL (ref 0.2–1.2)
Total Protein: 7.3 g/dL (ref 6.0–8.3)

## 2020-07-17 LAB — LIPID PANEL
Cholesterol: 180 mg/dL (ref 0–200)
HDL: 31.6 mg/dL — ABNORMAL LOW (ref >39.00)
LDL Cholesterol: 112 mg/dL — ABNORMAL HIGH (ref 0–99)
NonHDL: 148.69
Total CHOL/HDL Ratio: 6
Triglycerides: 183 mg/dL — ABNORMAL HIGH (ref 0.0–149.0)
VLDL: 36.6 mg/dL (ref 0.0–40.0)

## 2020-11-14 ENCOUNTER — Ambulatory Visit: Payer: Managed Care, Other (non HMO) | Admitting: Primary Care

## 2021-09-29 LAB — LIPID PANEL
Cholesterol: 198 (ref 0–200)
HDL: 36 (ref 35–70)
LDL Cholesterol: 29
Triglycerides: 163 — AB (ref 40–160)

## 2021-09-29 LAB — COMPREHENSIVE METABOLIC PANEL
Albumin: 4.3 (ref 3.5–5.0)
Calcium: 9.6 (ref 8.7–10.7)
Globulin: 2.7

## 2021-09-29 LAB — CBC AND DIFFERENTIAL
HCT: 40 (ref 36–46)
Hemoglobin: 13 (ref 12.0–16.0)
Platelets: 337 (ref 150–399)
WBC: 9.3

## 2021-09-29 LAB — HEPATIC FUNCTION PANEL
ALT: 18 (ref 7–35)
AST: 14 (ref 13–35)
Alkaline Phosphatase: 102 (ref 25–125)
Bilirubin, Total: 0.3

## 2021-09-29 LAB — BASIC METABOLIC PANEL
BUN: 15 (ref 4–21)
CO2: 21 (ref 13–22)
Chloride: 102 (ref 99–108)
Creatinine: 0.7 (ref 0.5–1.1)
Glucose: 107
Potassium: 4.4 (ref 3.4–5.3)
Sodium: 137 (ref 137–147)

## 2021-09-29 LAB — HEMOGLOBIN A1C: Hemoglobin A1C: 6.2

## 2021-09-29 LAB — CBC: RBC: 4.87 (ref 3.87–5.11)

## 2021-10-08 ENCOUNTER — Encounter: Payer: Self-pay | Admitting: Primary Care

## 2021-10-08 ENCOUNTER — Telehealth: Payer: Self-pay

## 2021-10-08 NOTE — Telephone Encounter (Signed)
Patient should have DM follow up in march see where appointment was made but cancelled do you want me to call about follow up. Also your last note has TDAP as 2015 but under immunization tab it has 2012 done at [facility]. I did not seen GYN note that had date do you know where I can find?   Received labs from GYN done 09/29/2021 have abstracted and placed in your folder for review.

## 2021-10-08 NOTE — Telephone Encounter (Signed)
I haven't seen patient since September 2021 so she absolutely needs follow up, especially to receive further medications. Please inquire and have her scheduled. Not sure regarding Tdap, you can ask her.

## 2021-10-10 NOTE — Telephone Encounter (Signed)
Left message to return call to our office.  

## 2021-10-22 NOTE — Telephone Encounter (Signed)
Will address tdap at next office visit that has been scheduled for next month.

## 2021-12-12 ENCOUNTER — Ambulatory Visit: Payer: Managed Care, Other (non HMO) | Admitting: Primary Care

## 2021-12-12 ENCOUNTER — Encounter: Payer: Self-pay | Admitting: Primary Care

## 2021-12-12 VITALS — BP 138/80 | HR 62 | Temp 98.6°F | Ht 61.0 in | Wt 288.0 lb

## 2021-12-12 DIAGNOSIS — K635 Polyp of colon: Secondary | ICD-10-CM | POA: Diagnosis not present

## 2021-12-12 DIAGNOSIS — E1165 Type 2 diabetes mellitus with hyperglycemia: Secondary | ICD-10-CM

## 2021-12-12 DIAGNOSIS — K219 Gastro-esophageal reflux disease without esophagitis: Secondary | ICD-10-CM | POA: Diagnosis not present

## 2021-12-12 DIAGNOSIS — Z0000 Encounter for general adult medical examination without abnormal findings: Secondary | ICD-10-CM | POA: Diagnosis not present

## 2021-12-12 DIAGNOSIS — E785 Hyperlipidemia, unspecified: Secondary | ICD-10-CM

## 2021-12-12 NOTE — Assessment & Plan Note (Signed)
Long discussion today regarding the need for weight loss through healthy diet and exercise. ? ?Remain off of rosuvastatin 5 mg for now. ? ?Repeat lipid panel in 3 months. ?

## 2021-12-12 NOTE — Assessment & Plan Note (Signed)
Intermittent, no concerns.  ? ?Discussed PRN use of famotidine 20 mg.  ?

## 2021-12-12 NOTE — Assessment & Plan Note (Signed)
Tetanus vaccine up-to-date per patient. ?Pap smear up-to-date per patient, follows with GYN. ?Mammogram up-to-date, follows with GYN. ? ?Discussed the importance of a healthy diet and regular exercise in order for weight loss, and to reduce the risk of further co-morbidity. ? ?Exam today stable. ?Labs to be collected in 3 months.  Labs were also reviewed from GYN visit in January 2023. ?

## 2021-12-12 NOTE — Progress Notes (Signed)
? ?Subjective:  ? ? Patient ID: Tracy Henry, female    DOB: 1976-03-02, 46 y.o.   MRN: [id number] ? ?HPI ? ?Tracy Henry is a very pleasant 46 y.o. female who presents today for complete physical and follow up of chronic conditions. ? ?She has not been seen since September 2021. She has not taken rosuvastatin since November 2021. She's not taken metformin in over 1 year.  ? ?Immunizations: ?-Tetanus: 2012, she thinks this is UTD.  ?-Influenza: Did not complete  ?-Covid-19: 2 vaccines  ? ? ?Diet: Fair diet. She started weight watchers in August 2022.  ?Exercise: No regular exercise. ? ? ?Pap Smear: UTD, follows with GYN ?Mammogram: Completed in January 2023, follows with GYN ?Colonoscopy: Completed in 2021, due 2024 ? ?BP Readings from Last 3 Encounters:  ?12/12/21 138/80  ?06/21/20 127/72  ?05/17/20 124/84  ? ? ? ? ? ?Review of Systems  ?Constitutional:  Negative for unexpected weight change.  ?HENT:  Negative for rhinorrhea.   ?Eyes:  Negative for visual disturbance.  ?Respiratory:  Negative for cough and shortness of breath.   ?Cardiovascular:  Negative for chest pain.  ?Gastrointestinal:  Negative for constipation and diarrhea.  ?Genitourinary:  Negative for difficulty urinating.  ?Musculoskeletal:  Negative for arthralgias and myalgias.  ?Skin:  Negative for rash.  ?Allergic/Immunologic: Positive for environmental allergies.  ?Neurological:  Negative for dizziness and headaches.  ?Psychiatric/Behavioral:  The patient is not nervous/anxious.   ? ?   ? ? ?Past Medical History:  ?Diagnosis Date  ? Environmental allergies   ? Hyperlipidemia   ? diet controlled, no meds  ? IDA (iron deficiency anemia)   ? Morbidly obese ([place])   ? Type 2 diabetes mellitus ([place])   ? followed by pcp  ? Uterine polyp   ? Removed in 2010  ? Wears glasses   ? ? ?Social History  ? ?Socioeconomic History  ? Marital status: Married  ?  Spouse name: Not on file  ? Number of children: Not on file  ? Years of education: Not on file  ? Highest  education level: Not on file  ?Occupational History  ? Not on file  ?Tobacco Use  ? Smoking status: Never  ? Smokeless tobacco: Never  ?Vaping Use  ? Vaping Use: Never used  ?Substance and Sexual Activity  ? Alcohol use: No  ?  Alcohol/week: 0.0 standard drinks  ? Drug use: Never  ? Sexual activity: Yes  ?  Birth control/protection: None  ?Other Topics Concern  ? Not on file  ?Social History Narrative  ? Married for 17 years   ? No children  ? Work for [employer] for 17 years  ? Enjoys reading  ? From [place]  ? ?Social Determinants of Health  ? ?Financial Resource Strain: Not on file  ?Food Insecurity: Not on file  ?Transportation Needs: Not on file  ?Physical Activity: Not on file  ?Stress: Not on file  ?Social Connections: Not on file  ?Intimate Partner Violence: Not on file  ? ? ?Past Surgical History:  ?Procedure Laterality Date  ? COLONOSCOPY WITH PROPOFOL N/A 06/21/2020  ? Procedure: COLONOSCOPY WITH PROPOFOL;  Surgeon: Virgel Manifold, MD;  Location: [facility];  Service: Endoscopy;  Laterality: N/A;  ? DILATATION & CURRETTAGE/HYSTEROSCOPY WITH RESECTOCOPE N/A 04/02/2015  ? Procedure: Protivin;  Surgeon: Brien Few, MD;  Location: [facility];  Service: Gynecology;  Laterality: N/A;  ? HYSTEROSCOPY WITH NOVASURE N/A 03/22/2019  ? Procedure: HYSTEROSCOPY WITH  POLYPECTOMY AND DILATATION AND CURRETTAGE;  Surgeon: Governor Specking, MD;  Location: [facility];  Service: Gynecology;  Laterality: N/A;  ? HYSTEROSCOPY WITH RESECTOSCOPE  11-11-2009   dr Ronita Hipps '@[facility]   ? polypectomy's  ? LAPAROSCOPIC CHOLECYSTECTOMY  09-28-2008   dr b. Grandville Silos  '@[facility]'$   ? [place]  approx.  ? ? ?Family History  ?Problem Relation Age of Onset  ? Hypertension Mother   ? Hypertension Father   ? Cancer Paternal Grandmother   ?     Ovarian cancer, breast?  ? Breast cancer Sister 50  ? ? ?No Known Allergies ? ?Current Outpatient Medications on File Prior  to Visit  ?Medication Sig Dispense Refill  ? metFORMIN (GLUCOPHAGE-XR) 500 MG 24 hr tablet Take 1 tablet (500 mg total) by mouth daily with breakfast. For diabetes. (Patient not taking: Reported on 12/12/2021) 90 tablet 1  ? rosuvastatin (CRESTOR) 5 MG tablet Take 1 tablet (5 mg total) by mouth every evening. For cholesterol. (Patient not taking: Reported on 12/12/2021) 90 tablet 3  ? ?No current facility-administered medications on file prior to visit.  ? ? ?BP 138/80   Pulse 62   Temp 98.6 ?F (37 ?C) (Oral)   Ht '5\' 1"'$  (1.549 m)   Wt 288 lb (130.6 kg)   SpO2 96%   BMI 54.42 kg/m?  ?Objective:  ? Physical Exam ?HENT:  ?   Right Ear: Tympanic membrane and ear canal normal.  ?   Left Ear: Tympanic membrane and ear canal normal.  ?   Nose: Nose normal.  ?Eyes:  ?   Conjunctiva/sclera: Conjunctivae normal.  ?   Pupils: Pupils are equal, round, and reactive to light.  ?Neck:  ?   Thyroid: No thyromegaly.  ?Cardiovascular:  ?   Rate and Rhythm: Normal rate and regular rhythm.  ?   Heart sounds: No murmur heard. ?Pulmonary:  ?   Effort: Pulmonary effort is normal.  ?   Breath sounds: Normal breath sounds. No rales.  ?Abdominal:  ?   General: Bowel sounds are normal.  ?   Palpations: Abdomen is soft.  ?   Tenderness: There is no abdominal tenderness.  ?Musculoskeletal:     ?   General: Normal range of motion.  ?   Cervical back: Neck supple.  ?Lymphadenopathy:  ?   Cervical: No cervical adenopathy.  ?Skin: ?   General: Skin is warm and dry.  ?   Findings: No rash.  ?Neurological:  ?   Mental Status: She is alert and oriented to person, place, and time.  ?   Cranial Nerves: No cranial nerve deficit.  ?   Deep Tendon Reflexes: Reflexes are normal and symmetric.  ?Psychiatric:     ?   Mood and Affect: Mood normal.  ? ? ? ? ? ?   ?Assessment & Plan:  ? ? ? ? ?This visit occurred during the SARS-CoV-2 public health emergency.  Safety protocols were in place, including screening questions prior to the visit, additional usage  of staff PPE, and extensive cleaning of exam room while observing appropriate contact time as indicated for disinfecting solutions.  ?

## 2021-12-12 NOTE — Assessment & Plan Note (Addendum)
A1c of 6.2 in mid January 2023, reviewed labs per GYN. ? ?Strongly advised she work on weight loss through a healthy diet and exercise.  ? ?We will repeat labs in 3 months.  ?Continue off of metformin XR 500 mg. ?

## 2021-12-12 NOTE — Patient Instructions (Signed)
Start exercising. You should be getting 150 minutes of moderate intensity exercise weekly. ? ?Continue with weight watchers. ? ?It was a pleasure to see you today! ? ?Preventive Care 68-47 Years Old, Female ?Preventive care refers to lifestyle choices and visits with your health care provider that can promote health and wellness. Preventive care visits are also called wellness exams. ?What can I expect for my preventive care visit? ?Counseling ?Your health care provider may ask you questions about your: ?Medical history, including: ?Past medical problems. ?Family medical history. ?Pregnancy history. ?Current health, including: ?Menstrual cycle. ?Method of birth control. ?Emotional well-being. ?Home life and relationship well-being. ?Sexual activity and sexual health. ?Lifestyle, including: ?Alcohol, nicotine or tobacco, and drug use. ?Access to firearms. ?Diet, exercise, and sleep habits. ?Work and work Statistician. ?Sunscreen use. ?Safety issues such as seatbelt and bike helmet use. ?Physical exam ?Your health care provider will check your: ?Height and weight. These may be used to calculate your BMI (body mass index). BMI is a measurement that tells if you are at a healthy weight. ?Waist circumference. This measures the distance around your waistline. This measurement also tells if you are at a healthy weight and may help predict your risk of certain diseases, such as type 2 diabetes and high blood pressure. ?Heart rate and blood pressure. ?Body temperature. ?Skin for abnormal spots. ?What immunizations do I need? ?Vaccines are usually given at various ages, according to a schedule. Your health care provider will recommend vaccines for you based on your age, medical history, and lifestyle or other factors, such as travel or where you work. ?What tests do I need? ?Screening ?Your health care provider may recommend screening tests for certain conditions. This may include: ?Lipid and cholesterol levels. ?Diabetes  screening. This is done by checking your blood sugar (glucose) after you have not eaten for a while (fasting). ?Pelvic exam and Pap test. ?Hepatitis B test. ?Hepatitis C test. ?HIV (human immunodeficiency virus) test. ?STI (sexually transmitted infection) testing, if you are at risk. ?Lung cancer screening. ?Colorectal cancer screening. ?Mammogram. Talk with your health care provider about when you should start having regular mammograms. This may depend on whether you have a family history of breast cancer. ?BRCA-related cancer screening. This may be done if you have a family history of breast, ovarian, tubal, or peritoneal cancers. ?Bone density scan. This is done to screen for osteoporosis. ?Talk with your health care provider about your test results, treatment options, and if necessary, the need for more tests. ?Follow these instructions at home: ?Eating and drinking ? ?Eat a diet that includes fresh fruits and vegetables, whole grains, lean protein, and low-fat dairy products. ?Take vitamin and mineral supplements as recommended by your health care provider. ?Do not drink alcohol if: ?Your health care provider tells you not to drink. ?You are pregnant, may be pregnant, or are planning to become pregnant. ?If you drink alcohol: ?Limit how much you have to 0-1 drink a day. ?Know how much alcohol is in your drink. In the [place] one drink equals one 12 oz bottle of beer (355 mL), one 5 oz glass of wine (148 mL), or one 1? oz glass of hard liquor (44 mL). ?Lifestyle ?Brush your teeth every morning and night with fluoride toothpaste. Floss one time each day. ?Exercise for at least 30 minutes 5 or more days each week. ?Do not use any products that contain nicotine or tobacco. These products include cigarettes, chewing tobacco, and vaping devices, such as e-cigarettes. If you need help  quitting, ask your health care provider. ?Do not use drugs. ?If you are sexually active, practice safe sex. Use a condom or other form of  protection to prevent STIs. ?If you do not wish to become pregnant, use a form of birth control. If you plan to become pregnant, see your health care provider for a prepregnancy visit. ?Take aspirin only as told by your health care provider. Make sure that you understand how much to take and what form to take. Work with your health care provider to find out whether it is safe and beneficial for you to take aspirin daily. ?Find healthy ways to manage stress, such as: ?Meditation, yoga, or listening to music. ?Journaling. ?Talking to a trusted person. ?Spending time with friends and family. ?Minimize exposure to UV radiation to reduce your risk of skin cancer. ?Safety ?Always wear your seat belt while driving or riding in a vehicle. ?Do not drive: ?If you have been drinking alcohol. Do not ride with someone who has been drinking. ?When you are tired or distracted. ?While texting. ?If you have been using any mind-altering substances or drugs. ?Wear a helmet and other protective equipment during sports activities. ?If you have firearms in your house, make sure you follow all gun safety procedures. ?Seek help if you have been physically or sexually abused. ?What's next? ?Visit your health care provider once a year for an annual wellness visit. ?Ask your health care provider how often you should have your eyes and teeth checked. ?Stay up to date on all vaccines. ?This information is not intended to replace advice given to you by your health care provider. Make sure you discuss any questions you have with your health care provider. ?Document Revised: 02/26/2021 Document Reviewed: 02/26/2021 ?Elsevier Patient Education ? [place]. ? ?

## 2021-12-12 NOTE — Assessment & Plan Note (Signed)
Colonoscopy UTD, due 2024. ?Discussed with patient today. ?

## 2022-02-26 ENCOUNTER — Other Ambulatory Visit: Payer: Self-pay | Admitting: Primary Care

## 2022-02-26 DIAGNOSIS — E1165 Type 2 diabetes mellitus with hyperglycemia: Secondary | ICD-10-CM

## 2022-03-13 ENCOUNTER — Other Ambulatory Visit (INDEPENDENT_AMBULATORY_CARE_PROVIDER_SITE_OTHER): Payer: Managed Care, Other (non HMO)

## 2022-03-13 DIAGNOSIS — E1165 Type 2 diabetes mellitus with hyperglycemia: Secondary | ICD-10-CM | POA: Diagnosis not present

## 2022-03-13 LAB — POCT GLYCOSYLATED HEMOGLOBIN (HGB A1C): Hemoglobin A1C: 6 % — AB (ref 4.0–5.6)

## 2022-12-02 LAB — HM MAMMOGRAPHY

## 2023-05-05 ENCOUNTER — Encounter: Payer: Self-pay | Admitting: Primary Care

## 2023-05-05 ENCOUNTER — Ambulatory Visit (INDEPENDENT_AMBULATORY_CARE_PROVIDER_SITE_OTHER): Payer: Managed Care, Other (non HMO) | Admitting: Primary Care

## 2023-05-05 VITALS — BP 130/84 | HR 76 | Temp 97.6°F | Ht 62.0 in | Wt 297.0 lb

## 2023-05-05 DIAGNOSIS — Z0000 Encounter for general adult medical examination without abnormal findings: Secondary | ICD-10-CM

## 2023-05-05 DIAGNOSIS — E785 Hyperlipidemia, unspecified: Secondary | ICD-10-CM | POA: Diagnosis not present

## 2023-05-05 DIAGNOSIS — Z23 Encounter for immunization: Secondary | ICD-10-CM | POA: Diagnosis not present

## 2023-05-05 DIAGNOSIS — K219 Gastro-esophageal reflux disease without esophagitis: Secondary | ICD-10-CM | POA: Diagnosis not present

## 2023-05-05 DIAGNOSIS — E1165 Type 2 diabetes mellitus with hyperglycemia: Secondary | ICD-10-CM

## 2023-05-05 LAB — MICROALBUMIN / CREATININE URINE RATIO
Creatinine,U: 54.2 mg/dL
Microalb Creat Ratio: 1.3 mg/g (ref 0.0–30.0)
Microalb, Ur: <0.7 mg/dL (ref 0.0–1.9)

## 2023-05-05 MED ORDER — ROSUVASTATIN CALCIUM 5 MG PO TABS: 5.0000 mg | ORAL_TABLET | Freq: Every day | ORAL | 3 refills | Status: DC

## 2023-05-05 NOTE — Assessment & Plan Note (Addendum)
Reviewed lipid panel from recent biometric screening. LDL of 127.  Discussed high risk for heart disease given her hyperlipidemia, obesity, and type 2 diabetes. She agrees to start statin therapy, discussed national guidelines and recommendations.  Start rosuvastatin 5 mg daily.  She was taking this previously. Repeat lipids next visit.

## 2023-05-05 NOTE — Assessment & Plan Note (Addendum)
Declines pneumonia vaccine. Updated tetanus vaccine today. Pap smear UTD. Mammogram UTD, follows with GYN Colonoscopy UTD, due 2024. She is aware.  Discussed the importance of a healthy diet and regular exercise in order for weight loss, and to reduce the risk of further co-morbidity.  Exam stable. Labs reviewed from recent biometric screening  Follow up in 1 year for repeat physical.

## 2023-05-05 NOTE — Progress Notes (Signed)
Subjective:    Patient ID: Tracy Henry, female    DOB: February 27, 1976, 47 y.o.   MRN: [id number]  HPI  Tracy Henry is a very pleasant 47 y.o. female who presents today for complete physical and follow up of chronic conditions.  Immunizations: -Tetanus: Completed per GYN.   Diet: Fair diet.  Exercise: No regular exercise.  Eye exam: Completes annually  Dental exam: Completes semi-annually    Pap Smear: UTD, follows with GYN Mammogram: March 2024  Colonoscopy: Completed in 2021, due October 2024. She is aware to schedule.   BP Readings from Last 3 Encounters:  05/05/23 130/84  12/12/21 138/80  06/21/20 127/72      Review of Systems  Constitutional:  Negative for unexpected weight change.  HENT:  Negative for rhinorrhea.   Respiratory:  Negative for cough and shortness of breath.   Cardiovascular:  Negative for chest pain.  Gastrointestinal:  Negative for constipation and diarrhea.  Genitourinary:  Negative for difficulty urinating.  Musculoskeletal:  Negative for arthralgias and myalgias.  Skin:  Negative for rash.  Allergic/Immunologic: Negative for environmental allergies.  Neurological:  Negative for dizziness, numbness and headaches.  Psychiatric/Behavioral:  The patient is not nervous/anxious.          Past Medical History:  Diagnosis Date   Cerumen impaction 11/15/2019   Environmental allergies    Hyperlipidemia    diet controlled, no meds   IDA (iron deficiency anemia)    Morbidly obese (HCC)    Type 2 diabetes mellitus (HCC)    followed by pcp   Uterine polyp    Removed in 2010   Wears glasses     Social History   Socioeconomic History   Marital status: Married    Spouse name: Not on file   Number of children: Not on file   Years of education: Not on file   Highest education level: Some college, no degree  Occupational History   Not on file  Tobacco Use   Smoking status: Never   Smokeless tobacco: Never  Vaping Use   Vaping status:  Never Used  Substance and Sexual Activity   Alcohol use: No    Alcohol/week: 0.0 standard drinks of alcohol   Drug use: Never   Sexual activity: Yes    Birth control/protection: None  Other Topics Concern   Not on file  Social History Narrative   Married for 17 years    No children   Work for [employer] for 17 years   Enjoys reading   From [employer]   Social Determinants of Health   Financial Resource Strain: Low Risk  (05/01/2023)   Overall Financial Resource Strain (CARDIA)    Difficulty of Paying Living Expenses: Not hard at all  Food Insecurity: No Food Insecurity (05/01/2023)   Hunger Vital Sign    Worried About Running Out of Food in the Last Year: Never true    Ran Out of Food in the Last Year: Never true  Transportation Needs: No Transportation Needs (05/01/2023)   PRAPARE - Administrator, Civil Service (Medical): No    Lack of Transportation (Non-Medical): No  Physical Activity: Insufficiently Active (05/01/2023)   Exercise Vital Sign    Days of Exercise per Week: 1 day    Minutes of Exercise per Session: 10 min  Stress: No Stress Concern Present (05/01/2023)   [employer] of Occupational Health - Occupational Stress Questionnaire    Feeling of Stress : Not at all  Social  Connections: Moderately Integrated (05/01/2023)   Social Connection and Isolation Panel [NHANES]    Frequency of Communication with Friends and Family: More than three times a week    Frequency of Social Gatherings with Friends and Family: Once a week    Attends Religious Services: More than 4 times per year    Active Member of [employer] or Organizations: No    Attends Banker Meetings: Not on file    Marital Status: Married  Catering manager Violence: Not on file    Past Surgical History:  Procedure Laterality Date   COLONOSCOPY WITH PROPOFOL N/A 06/21/2020   Procedure: COLONOSCOPY WITH PROPOFOL;  Surgeon: Pasty Spillers, MD;  Location: [facility];  Service:  Endoscopy;  Laterality: N/A;   DILATATION & CURRETTAGE/HYSTEROSCOPY WITH RESECTOCOPE N/A 04/02/2015   Procedure: DILATATION & CURETTAGE/HYSTEROSCOPY WITH RESECTOCOPE;  Surgeon: Olivia Mackie, MD;  Location: [facility];  Service: Gynecology;  Laterality: N/A;   HYSTEROSCOPY WITH NOVASURE N/A 03/22/2019   Procedure: HYSTEROSCOPY WITH POLYPECTOMY AND DILATATION AND CURRETTAGE;  Surgeon: Fermin Schwab, MD;  Location: [facility];  Service: Gynecology;  Laterality: N/A;   HYSTEROSCOPY WITH RESECTOSCOPE  11-11-2009   dr Billy Coast @[facility]    polypectomy's   LAPAROSCOPIC CHOLECYSTECTOMY  09-28-2008   dr b. Janee Morn  @[facility]    TONSILLECTOMY AND ADENOIDECTOMY  1984  approx.    Family History  Problem Relation Age of Onset   Hypertension Mother    Hypertension Father    Cancer Paternal Grandmother        Ovarian cancer, breast?   Breast cancer Sister 50    No Known Allergies  No current outpatient medications on file prior to visit.   No current facility-administered medications on file prior to visit.    BP 130/84   Pulse 76   Temp 97.6 F (36.4 C) (Temporal)   Ht 5\' 2"  (1.575 m)   Wt 297 lb (134.7 kg)   LMP 04/16/2023   SpO2 98%   BMI 54.32 kg/m  Objective:   Physical Exam HENT:     Right Ear: Tympanic membrane and ear canal normal.     Left Ear: Tympanic membrane and ear canal normal.     Nose: Nose normal.  Eyes:     Conjunctiva/sclera: Conjunctivae normal.     Pupils: Pupils are equal, round, and reactive to light.  Neck:     Thyroid: No thyromegaly.  Cardiovascular:     Rate and Rhythm: Normal rate and regular rhythm.     Heart sounds: No murmur heard. Pulmonary:     Effort: Pulmonary effort is normal.     Breath sounds: Normal breath sounds. No rales.  Abdominal:     General: Bowel sounds are normal.     Palpations: Abdomen is soft.     Tenderness: There is no abdominal tenderness.  Musculoskeletal:        General: Normal range of motion.     Cervical back:  Neck supple.  Lymphadenopathy:     Cervical: No cervical adenopathy.  Skin:    General: Skin is warm and dry.     Findings: No rash.  Neurological:     Mental Status: She is alert and oriented to person, place, and time.     Cranial Nerves: No cranial nerve deficit.     Deep Tendon Reflexes: Reflexes are normal and symmetric.  Psychiatric:        Mood and Affect: Mood normal.  Assessment & Plan:  Preventative health care Assessment & Plan: Declines pneumonia vaccine. Updated tetanus vaccine today. Pap smear UTD. Mammogram UTD, follows with GYN Colonoscopy UTD, due 2024. She is aware.  Discussed the importance of a healthy diet and regular exercise in order for weight loss, and to reduce the risk of further co-morbidity.  Exam stable. Labs reviewed from recent biometric screening  Follow up in 1 year for repeat physical.    Gastroesophageal reflux disease, unspecified whether esophagitis present Assessment & Plan: Controlled. Not requiring treatment.  Continue to monitor.   Type 2 diabetes mellitus with hyperglycemia, without long-term current use of insulin (HCC) Assessment & Plan: Recent A1C of 6.7.  Declines treatment as she will work on lifestyle changes.  Foot exam today. Urine microalbumin pending.  Follow up in 6 months   Orders: -     Microalbumin / creatinine urine ratio -     Referral to Nutrition and Diabetes Services  Hyperlipidemia, unspecified hyperlipidemia type Assessment & Plan: Reviewed lipid panel from recent biometric screening. LDL of 127.  Discussed high risk for heart disease given her hyperlipidemia, obesity, and type 2 diabetes. She agrees to start statin therapy, discussed national guidelines and recommendations.  Start rosuvastatin 5 mg daily.  She was taking this previously. Repeat lipids next visit.   Orders: -     Rosuvastatin Calcium; Take 1 tablet (5 mg total) by mouth daily. for cholesterol.  Dispense:  90 tablet; Refill: 3        Doreene Nest, NP

## 2023-05-05 NOTE — Assessment & Plan Note (Signed)
Controlled. Not requiring treatment.  Continue to monitor.

## 2023-05-05 NOTE — Telephone Encounter (Signed)
Can we abstract these labs?

## 2023-05-05 NOTE — Assessment & Plan Note (Addendum)
Recent A1C of 6.7.  Declines treatment as she will work on lifestyle changes.  Foot exam today. Urine microalbumin pending.  Follow up in 6 months

## 2023-05-05 NOTE — Patient Instructions (Addendum)
Stop by the lab prior to leaving today. I will notify you of your results once received.   Start rosuvastatin 5 mg daily for cholesterol.  Please schedule a follow up visit for 6 months for a diabetes check.  It was a pleasure to see you today!

## 2023-05-05 NOTE — Addendum Note (Signed)
Addended by: Donnamarie Poag on: 05/05/2023 02:27 PM   Modules accepted: Orders

## 2023-06-14 ENCOUNTER — Encounter: Payer: Managed Care, Other (non HMO) | Attending: Primary Care | Admitting: Dietician

## 2023-06-14 ENCOUNTER — Encounter: Payer: Self-pay | Admitting: Dietician

## 2023-06-14 DIAGNOSIS — E1165 Type 2 diabetes mellitus with hyperglycemia: Secondary | ICD-10-CM | POA: Diagnosis present

## 2023-06-14 DIAGNOSIS — Z713 Dietary counseling and surveillance: Secondary | ICD-10-CM | POA: Diagnosis not present

## 2023-06-14 NOTE — Patient Instructions (Addendum)
Your goal for fluids in the day is 64 oz, with most of it being from water.  Try a high protein granola bar Ashby Dawes [place], [place], etc.) as a quick breakfast option. Try to find bars with 10g of protein.  Get up and move/walk around for 1-2 minutes every hour when working from home!  Work towards eating three meals a day, about 5-6 hours apart!  Begin to recognize carbohydrates, proteins, and non-starchy vegetables in your food choices!  Begin to build your meals using the proportions of the Balanced Plate. First, select your carb choice(s) for the meal. Make this 25% of your meal. Next, select your source of protein to pair with your carb choice(s). Make this another 25% of your meal. Choose LEAN proteins as often as possible! Finally, complete your meal with a variety of non-starchy vegetables. Make this the remaining 50% of your meal.

## 2023-06-14 NOTE — Progress Notes (Signed)
Diabetes Self-Management Education  Visit Type: First/Initial  Appt. Start Time: 0945 Appt. End Time: 1115  06/14/2023  [name], identified by name and date of birth, is a 47 y.o. female with a diagnosis of Diabetes: Type 2.   ASSESSMENT  There were no vitals taken for this visit. There is no height or weight on file to calculate BMI.  Pt reports being prescribed Rosuvastatin, has not started taking it yet, reports getting lipid panel and A1c at work preventative screening for work, LDL - 127, A1c - 6.7%. Pt states they do not like taking pills, does not want to start DM medication, has been low level diabetic for years. Pt reports living a busy life, has 81 yr old daughter, both pt and spouse work full-time. Pt reports having a love/hate relationship with exercise, states they will do well for a couple of months and then it becomes difficult to manage time. Pt reports having difficulty drinking water consistently.  Pt reports skipping breakfast daily, doesn't like many breakfast foods. Pt reports enjoying carbs, especially potatoes, will eat large portions routinely.    Diabetes Self-Management Education - 06/14/23 0954       Visit Information   Visit Type First/Initial      Initial Visit   Diabetes Type Type 2    Date Diagnosed August 2016    Are you currently following a meal plan? No    Are you taking your medications as prescribed? Not on Medications      Health Coping   How would you rate your overall health? Good      Psychosocial Assessment   Patient Belief/Attitude about Diabetes Motivated to manage diabetes    What is the hardest part about your diabetes right now, causing you the most concern, or is the most worrisome to you about your diabetes?   Making healty food and beverage choices;Getting support / problem solving   Time management   Self-care barriers None    Self-management support Family;Doctor's office    Other persons present Patient    Patient  Concerns Healthy Lifestyle;Nutrition/Meal planning;Weight Control    Special Needs None    Preferred Learning Style Other (comment)    Learning Readiness Contemplating    How often do you need to have someone help you when you read instructions, pamphlets, or other written materials from your doctor or pharmacy? 1 - Never    What is the last grade level you completed in school? college      Pre-Education Assessment   Patient understands the diabetes disease and treatment process. Needs Instruction    Patient understands incorporating nutritional management into lifestyle. Needs Instruction    Patient undertands incorporating physical activity into lifestyle. Needs Instruction    Patient understands using medications safely. Needs Instruction    Patient understands monitoring blood glucose, interpreting and using results Needs Instruction    Patient understands prevention, detection, and treatment of acute complications. Needs Instruction    Patient understands prevention, detection, and treatment of chronic complications. Needs Instruction    Patient understands how to develop strategies to address psychosocial issues. Needs Instruction    Patient understands how to develop strategies to promote health/change behavior. Needs Instruction      Complications   Last HgB A1C per patient/outside source 6.7 %    How often do you check your blood sugar? 0 times/day (not testing)    Have you had a dilated eye exam in the past 12 months? Yes  Have you had a dental exam in the past 12 months? Yes    Are you checking your feet? No      Dietary Intake   Breakfast 2 pancakes, 2 strips bacon from [place]    Snack (afternoon) Pringles    Dinner 2 hot dogs, small portion of mac and cheese, carrots, 4 pecan delights    Beverage(s) Water, Dr Marisue Ivan      Activity / Exercise   Activity / Exercise Type ADL's    How many days per week do you exercise? 0    How many minutes per day do you  exercise? 0    Total minutes per week of exercise 0      Patient Education   Previous Diabetes Education No    Disease Pathophysiology Factors that contribute to the development of diabetes;Definition of diabetes, type 1 and 2, and the diagnosis of diabetes;Explored patient's options for treatment of their diabetes    Healthy Eating Role of diet in the treatment of diabetes and the relationship between the three main macronutrients and blood glucose level;Plate Method;Meal options for control of blood glucose level and chronic complications.    Being Active Helped patient identify appropriate exercises in relation to his/her diabetes, diabetes complications and other health issue.;Role of exercise on diabetes management, blood pressure control and cardiac health.    Monitoring Identified appropriate SMBG and/or A1C goals.    Chronic complications Relationship between chronic complications and blood glucose control;Lipid levels, blood glucose control and heart disease    Diabetes Stress and Support Role of stress on diabetes    Lifestyle and Health Coping Lifestyle issues that need to be addressed for better diabetes care      Individualized Goals (developed by patient)   Nutrition Follow meal plan discussed    Physical Activity Exercise 1-2 times per week    Medications Not Applicable    Monitoring  Not Applicable    Problem Solving Eating Pattern;Addressing barriers to behavior change      Post-Education Assessment   Patient understands the diabetes disease and treatment process. Needs Review    Patient understands incorporating nutritional management into lifestyle. Needs Review    Patient undertands incorporating physical activity into lifestyle. Needs Review    Patient understands using medications safely. N/A    Patient understands monitoring blood glucose, interpreting and using results N/A    Patient understands prevention, detection, and treatment of acute complications. N/A     Patient understands prevention, detection, and treatment of chronic complications. Needs Review    Patient understands how to develop strategies to address psychosocial issues. Needs Review    Patient understands how to develop strategies to promote health/change behavior. Needs Review      Outcomes   Expected Outcomes Demonstrated limited interest in learning.  Expect minimal changes    Future DMSE PRN             Individualized Plan for Diabetes Self-Management Training:   Learning Objective:  Patient will have a greater understanding of diabetes self-management. Patient education plan is to attend individual and/or group sessions per assessed needs and concerns.   Plan:   Patient Instructions  Your goal for fluids in the day is 64 oz, with most of it being from water.  Try a high protein granola bar Ashby Dawes [place], [place], etc.) as a quick breakfast option. Try to find bars with 10g of protein.  Get up and move/walk around for 1-2 minutes every hour when working from  home!  Work towards eating three meals a day, about 5-6 hours apart!  Begin to recognize carbohydrates, proteins, and non-starchy vegetables in your food choices!  Begin to build your meals using the proportions of the Balanced Plate. First, select your carb choice(s) for the meal. Make this 25% of your meal. Next, select your source of protein to pair with your carb choice(s). Make this another 25% of your meal. Choose LEAN proteins as often as possible! Finally, complete your meal with a variety of non-starchy vegetables. Make this the remaining 50% of your meal.    Expected Outcomes:  Demonstrated limited interest in learning.  Expect minimal changes  Education material provided: My Plate  If problems or questions, patient to contact team via:  Phone and Email  Future DSME appointment: PRN

## 2023-11-05 ENCOUNTER — Ambulatory Visit: Payer: Managed Care, Other (non HMO) | Admitting: Primary Care

## 2023-12-15 LAB — HM MAMMOGRAPHY

## 2023-12-29 ENCOUNTER — Other Ambulatory Visit: Payer: Self-pay | Admitting: Obstetrics and Gynecology

## 2023-12-29 DIAGNOSIS — R1900 Intra-abdominal and pelvic swelling, mass and lump, unspecified site: Secondary | ICD-10-CM

## 2024-01-13 ENCOUNTER — Ambulatory Visit
Admission: RE | Admit: 2024-01-13 | Discharge: 2024-01-13 | Disposition: A | Discharge status: Home / Self Care | Source: Ambulatory Visit | Attending: Obstetrics and Gynecology | Admitting: Obstetrics and Gynecology

## 2024-01-13 DIAGNOSIS — R1900 Intra-abdominal and pelvic swelling, mass and lump, unspecified site: Secondary | ICD-10-CM

## 2024-01-13 MED ORDER — GADOPICLENOL 0.5 MMOL/ML IV SOLN
10.0000 mL | Freq: Once | INTRAVENOUS | Status: AC | PRN
  Administered 2024-01-13: 10 mL via INTRAVENOUS

## 2024-01-14 ENCOUNTER — Telehealth: Payer: Self-pay | Admitting: *Deleted

## 2024-01-14 NOTE — Telephone Encounter (Signed)
 LMOM for the patient to call the office back. Patient needs to be scheduled on 5/29 as a new patient with Dr Orvil Bland.

## 2024-01-17 NOTE — Telephone Encounter (Signed)
 Spoke with Ms,Broadus regarding her referral to GYN oncology. She has an appointment scheduled with Dr. Orvil Bland on 5/29 at 11:15. Patient agrees to date and time. She has been provided with office address and location. She is also aware of our mask and visitor policy. Patient verbalized understanding and will call with any questions.

## 2024-02-09 ENCOUNTER — Encounter: Payer: Self-pay | Admitting: Gynecologic Oncology

## 2024-02-09 NOTE — H&P (View-Only) (Signed)
 GYNECOLOGIC ONCOLOGY NEW PATIENT CONSULTATION   Patient Name: Tracy Henry  Patient Age: 48 y.o. Date of Service: 02/10/24 Referring Provider: Meriam Stamp, MD  Primary Care Provider: Gabriel John, NP Consulting Provider: Wiley Hanger, MD   Assessment/Plan:  Pre versus perimenopausal patient with mostly cystic adnexal mass, at least endometrial intraepithelial neoplasia.  We discussed her pelvic mass, which appears to be adnexal in origin.  While complex, the cyst is mostly simple and on MRI does not have features that raise concern for borderline tumor or malignancy.  Recent CA125 was normal.    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     Patient desires to proceed with surgical management.  Given plan for surgical management of her large adnexal mass, we discussed surgery to address both problems concurrently.  The patient is a suitable candidate for hysterectomy via a minimally invasive approach to surgery.  Given her age, we discussed the risks and benefits of BSO at the time of surgery.  We discussed that pending final pathology results, she would be a candidate for estrogen replacement therapy until closer to the average age of menopause.  We reviewed that robotic assistance would be used to complete the surgery.    We discussed that endometrial cancer is detected in about 40% of final uterine pathology specimens from patients with EIN.  Given pathology from her recent procedure showing EIN only in the polyp specimen (endometrial curettings negative), I would estimate her risk of concurrent cancer is much lower than 40%.   We reviewed 3 options at the time of surgery.  The first would be to send the uterus for frozen section at the time of surgery.  If cancer found, lymphadenectomy may be indicated.  The section option would be to inject  ICG and perform sentinel lymph node mapping but still send the uterus for frozen section to help determine whether sentinel lymph nodes required removal.  The third would be to inject ICG and perform sentinel lymph node biopsy without sending the uterus for frozen.  This would mean potentially removing sentinel lymph nodes unnecessarily. After our discussion, she is in agreement to proceed with sentinel lymph node mapping (without biopsy) and frozen section.  We discussed that any lymph node assessment made to be deferred based on toleration of Trendelenburg and insufflation on the day of surgery.   Potential benefits of sentinel nodes including a higher detection rate for metastasis due to ultrastaging and potential reduction in operative morbidity. However, there remains uncertainty as to the role for treatment of micrometastatic disease. Further, the benefit of operative morbidity associated with the SLN technique in endometrial cancer is not yet completely known. In other patient populations (e.g. the cervical cancer population) there has been observed reductions in morbidity with SLN biopsy compared to pelvic lymphadenectomy. Lymphedema, nerve dysfunction and lymphocysts are all potential risks with the SLN technique as with complete lymphadenectomy. Additional risks to the patient include the risk of damage to an internal organ while operating in an altered view (e.g. the black and white image of the robotic fluorescence imaging mode).    Given her weight, we discussed the possibility that she does not tolerate Trendelenburg or abdominal insufflation and Trendelenburg.  If this is the case, goal would be to remove the adnexal mass via a mini laparotomy and proceed with hysteroscopy, dilation and curettage, and Mirena IUD placement for treatment of  her EIN while we aggressively work towards weight loss to make definitive surgery possible.  Discussed the plan for a robotic assisted hysterectomy, bilateral  salpingo-oophorectomy, possible sentinel lymph node evaluation, possible lymph node dissection, possible mini-laparotomy versus laparotomy, possible hysteroscopy with dilation and curettage, possible Mirena IUD placement. The risks of surgery were discussed in detail and she understands these to include infection; wound separation; hernia; vaginal cuff separation, injury to adjacent organs such as bowel, bladder, blood vessels, ureters and nerves; bleeding which may require blood transfusion; anesthesia risk; thromboembolic events; possible death; unforeseen complications; possible need for re-exploration; medical complications such as heart attack, stroke, pleural effusion and pneumonia; and, if full lymphadenectomy is performed the risk of lymphedema and lymphocyst. The patient will receive DVT and antibiotic prophylaxis as indicated. She voiced a clear understanding. She had the opportunity to ask questions. Perioperative instructions were reviewed with her. Prescriptions for post-op medications were sent to her pharmacy of choice.  A copy of this note was sent to the patient's referring provider.   70 minutes of total time was spent for this patient encounter, including preparation, face-to-face counseling with the patient and coordination of care, and documentation of the encounter.  Wiley Hanger, MD  Division of Gynecologic Oncology  Department of Obstetrics and Gynecology  [facility]  ___________________________________________  Chief Complaint: Chief Complaint  Patient presents with   Pelvic Mass    History of Present Illness:  Tracy Henry is a 48 y.o. y.o. female who is seen in consultation at the request of Dr. Harless Lien for an evaluation of a complex but mostly simple pelvic mass, abnormal uterine bleeding.   Patient has a long history of recurrent endometrial polyps. In 2011, patient underwent D&C and polypectomy.  Pathology showed benign fragments of  endometrium and pseudodecidualization of the stroma consistent with hormone effect.  No hyperplasia or malignancy. In 2016, in the setting of abnormal uterine bleeding, the patient underwent hysteroscopy with D&C, polypectomy.  Findings at the time of her surgery included multiple endometrial polyps.  Pathology revealed proliferative endometrium with simple hyperplasia without atypia, fragments of endometrial polyp.  No atypia or carcinoma noted. In 03/2019, she underwent hysteroscopy, polypectomy, and D&C.  Pathology at that time showed benign endometrial type polyp, secretory endometrium with breakdown.  No hyperplasia or malignancy noted. At the time of this surgery in 2020, outside ultrasound revealed a 2.1 cm simple left ovarian cyst.  More recently, she presented again with abnormal uterine bleeding. Pelvic ultrasound exam at [facility] on/15/25: Uterus measures 8.2 x 4.7 x 5 cm with a thickened endometrium measuring 12.3 mm.  Bilateral ovaries with PCOS appearance.  Large complex cystic mass superior and anterior to the uterus measuring 11.8 x 10.2 cm with internal layering debris and at least 1 septation. CA125: 6.7. MRI of the pelvis on 01/13/2024: Large, relatively simple fluid signal cyst in the midline pelvis measures 15 x 11.4 x 12.6 cm with a single thin septation.  Appears to arise from the left ovary although both ovaries closely abut the mass.  Ovaries otherwise normal in appearance with multiple small functional follicles bilaterally.  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  No overtly solid mass or suspicious contrast-enhancement.  No adenopathy or evidence of metastatic disease.  Recent endometrial biopsy showed EIN, some changes of suggestive but not diagnostic of well-differentiated endometrial adenocarcinoma.  Her last hemoglobin A1c in August 2024 was 6.7%.  Today, the patient reports overall doing well.  She denies ever being on any hormonal  treatment with her abnormal uterine  bleeding history.  She notes some irregular bleeding earlier this year with light bleeding in March and April although not which she considers a full menstrual cycle.  She denies any bleeding since April.  She has had several episodes, on average about once a month, of low back pain.  Typically, she has increased bleeding or will pass a clot at the time that she has this pain.  Pain last a couple of minutes.  She endorses normal bowel and bladder function.  Has had some weight gain in the last 6 months.  PAST MEDICAL HISTORY:  Past Medical History:  Diagnosis Date   Cerumen impaction 11/15/2019   Environmental allergies    Hyperlipidemia    diet controlled, no meds   IDA (iron deficiency anemia)    Morbidly obese (HCC)    Obesity, morbid, BMI 50 or higher (HCC)    Type 2 diabetes mellitus (HCC)    followed by pcp   Uterine polyp    Removed in 2010   Wears glasses      PAST SURGICAL HISTORY:  Past Surgical History:  Procedure Laterality Date   COLONOSCOPY WITH PROPOFOL  N/A 06/21/2020   Procedure: COLONOSCOPY WITH PROPOFOL ;  Surgeon: Irby Mannan, MD;  Location: [facility];  Service: Endoscopy;  Laterality: N/A;   DILATATION & CURRETTAGE/HYSTEROSCOPY WITH RESECTOCOPE N/A 04/02/2015   Procedure: DILATATION & CURETTAGE/HYSTEROSCOPY WITH RESECTOCOPE;  Surgeon: Meriam Stamp, MD;  Location: [facility];  Service: Gynecology;  Laterality: N/A;   HYSTEROSCOPY WITH NOVASURE N/A 03/22/2019   Procedure: HYSTEROSCOPY WITH POLYPECTOMY AND DILATATION AND CURRETTAGE;  Surgeon: Yalcinkaya, Tamer, MD;  Location: [facility];  Service: Gynecology;  Laterality: N/A;   HYSTEROSCOPY WITH RESECTOSCOPE  11-11-2009   dr Harless Lien @[facility]    polypectomy's   LAPAROSCOPIC CHOLECYSTECTOMY  09-28-2008   dr b. Hildy Lowers  @[facility]    TONSILLECTOMY AND ADENOIDECTOMY  1984  approx.    OB/GYN HISTORY:  OB History  Gravida Para Term Preterm AB Living  0 0 0 0 0 0  SAB IAB Ectopic Multiple Live Births  0  0 0 0 0    No LMP recorded.  Age at menarche: 68  Hx of STDs: denies Last pap: 11/2022 - NIML, HR HPV not performed History of abnormal pap smears: denies  SCREENING STUDIES:  Last mammogram: 2025  Last colonoscopy: 2021  MEDICATIONS: Outpatient Encounter Medications as of 02/10/2024  Medication Sig   rosuvastatin  (CRESTOR ) 5 MG tablet Take 1 tablet (5 mg total) by mouth daily. for cholesterol. (Patient not taking: Reported on 02/09/2024)   No facility-administered encounter medications on file as of 02/10/2024.    ALLERGIES:  No Known Allergies   FAMILY HISTORY:  Family History  Problem Relation Age of Onset   Hypertension Mother    Liver cancer Mother    Hypertension Father    Breast cancer Sister 77   Breast cancer Paternal Grandmother    Ovarian cancer Paternal Grandmother    Colon cancer Neg Hx    Pancreatic cancer Neg Hx    Prostate cancer Neg Hx    Endometrial cancer Neg Hx      SOCIAL HISTORY:  Social Connections: Moderately Integrated (05/01/2023)   Social Connection and Isolation Panel [NHANES]    Frequency of Communication with Friends and Family: More than three times a week    Frequency of Social Gatherings with Friends and Family: Once a week    Attends Religious Services: More than 4 times per  year    Active Member of Clubs or Organizations: No    Attends Engineer, structural: Not on file    Marital Status: Married    REVIEW OF SYSTEMS:  Denies appetite changes, fevers, chills, fatigue, unexplained weight changes. Denies hearing loss, neck lumps or masses, mouth sores, ringing in ears or voice changes. Denies cough or wheezing.  Denies shortness of breath. Denies chest pain or palpitations. Denies leg swelling. Denies abdominal distention, pain, blood in stools, constipation, diarrhea, nausea, vomiting, or early satiety. Denies pain with intercourse, dysuria, frequency, hematuria or incontinence. Denies hot flashes, pelvic pain, vaginal  bleeding or vaginal discharge.   Denies joint pain, back pain or muscle pain/cramps. Denies itching, rash, or wounds. Denies dizziness, headaches, numbness or seizures. Denies swollen lymph nodes or glands, denies easy bruising or bleeding. Denies anxiety, depression, confusion, or decreased concentration.  Physical Exam:  Vital Signs for this encounter:  Blood pressure 139/83, pulse 72, temperature 97.7 F (36.5 C), temperature source Oral, resp. rate 18, height 5' 2.01" (1.575 m), weight (!) 302 lb 9.6 oz (137.3 kg), SpO2 96%. Body mass index is 55.33 kg/m. General: Alert, oriented, no acute distress.  HEENT: Normocephalic, atraumatic. Sclera anicteric.  Chest: Clear to auscultation bilaterally. No wheezes, rhonchi, or rales. Cardiovascular: Regular rate and rhythm, no murmurs, rubs, or gallops.  Abdomen: Obese. Normoactive bowel sounds. Soft, nondistended, nontender to palpation. No masses or hepatosplenomegaly appreciated. No palpable fluid wave.  Extremities: Grossly normal range of motion. Warm, well perfused. No edema bilaterally.  Skin: No rashes or lesions.  Lymphatics: No cervical, supraclavicular, or inguinal adenopathy.  GU:  Normal external female genitalia. No lesions. No discharge or bleeding.             Bladder/urethra:  No lesions or masses, well supported bladder             Vagina: Well-rugated, no lesions.             Cervix: Normal appearing, no lesions.             Uterus: Small, mobile, no parametrial involvement or nodularity.             Adnexa: Somewhat limited by body habitus but there is a mass that moves in conjunction with the uterus, better palpated with the abdominal hand.   Rectal: Deferred.   LABORATORY AND RADIOLOGIC DATA:  Outside medical records were reviewed to synthesize the above history, along with the history and physical obtained during the visit.   Lab Results  Component Value Date   WBC 9.3 09/29/2021   HGB 13.0 09/29/2021   HCT 40  09/29/2021   PLT 337 09/29/2021   GLUCOSE 105 (H) 05/15/2020   CHOL 198 09/29/2021   TRIG 163 (A) 09/29/2021   HDL 36 09/29/2021   LDLCALC 29 09/29/2021   ALT 18 09/29/2021   AST 14 09/29/2021   NA 137 09/29/2021   K 4.4 09/29/2021   CL 102 09/29/2021   CREATININE 0.7 09/29/2021   BUN 15 09/29/2021   CO2 21 09/29/2021   TSH 1.71 01/09/2015   HGBA1C 6.0 (A) 03/13/2022   MICROALBUR <0.7 05/05/2023

## 2024-02-09 NOTE — Progress Notes (Unsigned)
 GYNECOLOGIC ONCOLOGY NEW PATIENT CONSULTATION   Patient Name: Tracy Henry  Patient Age: 48 y.o. Date of Service: 02/10/24 Referring Provider: Meriam Stamp, MD  Primary Care Provider: Gabriel John, NP Consulting Provider: Wiley Hanger, MD   Assessment/Plan:  Pre versus perimenopausal patient with mostly cystic adnexal mass, at least endometrial intraepithelial neoplasia.  We discussed her pelvic mass, which appears to be adnexal in origin.  While complex, the cyst is mostly simple and on MRI does not have features that raise concern for borderline tumor or malignancy.  Recent CA125 was normal.    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     Patient desires to proceed with surgical management.  Given plan for surgical management of her large adnexal mass, we discussed surgery to address both problems concurrently.  The patient is a suitable candidate for hysterectomy via a minimally invasive approach to surgery.  Given her age, we discussed the risks and benefits of BSO at the time of surgery.  We discussed that pending final pathology results, she would be a candidate for estrogen replacement therapy until closer to the average age of menopause.  We reviewed that robotic assistance would be used to complete the surgery.    We discussed that endometrial cancer is detected in about 40% of final uterine pathology specimens from patients with EIN.  Given pathology from her recent procedure showing EIN only in the polyp specimen (endometrial curettings negative), I would estimate her risk of concurrent cancer is much lower than 40%.   We reviewed 3 options at the time of surgery.  The first would be to send the uterus for frozen section at the time of surgery.  If cancer found, lymphadenectomy may be indicated.  The section option would be to inject  ICG and perform sentinel lymph node mapping but still send the uterus for frozen section to help determine whether sentinel lymph nodes required removal.  The third would be to inject ICG and perform sentinel lymph node biopsy without sending the uterus for frozen.  This would mean potentially removing sentinel lymph nodes unnecessarily. After our discussion, she is in agreement to proceed with sentinel lymph node mapping (without biopsy) and frozen section.  We discussed that any lymph node assessment made to be deferred based on toleration of Trendelenburg and insufflation on the day of surgery.   Potential benefits of sentinel nodes including a higher detection rate for metastasis due to ultrastaging and potential reduction in operative morbidity. However, there remains uncertainty as to the role for treatment of micrometastatic disease. Further, the benefit of operative morbidity associated with the SLN technique in endometrial cancer is not yet completely known. In other patient populations (e.g. the cervical cancer population) there has been observed reductions in morbidity with SLN biopsy compared to pelvic lymphadenectomy. Lymphedema, nerve dysfunction and lymphocysts are all potential risks with the SLN technique as with complete lymphadenectomy. Additional risks to the patient include the risk of damage to an internal organ while operating in an altered view (e.g. the black and white image of the robotic fluorescence imaging mode).    Given her weight, we discussed the possibility that she does not tolerate Trendelenburg or abdominal insufflation and Trendelenburg.  If this is the case, goal would be to remove the adnexal mass via a mini laparotomy and proceed with hysteroscopy, dilation and curettage, and Mirena IUD placement for treatment of  her EIN while we aggressively work towards weight loss to make definitive surgery possible.  Discussed the plan for a robotic assisted hysterectomy, bilateral  salpingo-oophorectomy, possible sentinel lymph node evaluation, possible lymph node dissection, possible mini-laparotomy versus laparotomy, possible hysteroscopy with dilation and curettage, possible Mirena IUD placement. The risks of surgery were discussed in detail and she understands these to include infection; wound separation; hernia; vaginal cuff separation, injury to adjacent organs such as bowel, bladder, blood vessels, ureters and nerves; bleeding which may require blood transfusion; anesthesia risk; thromboembolic events; possible death; unforeseen complications; possible need for re-exploration; medical complications such as heart attack, stroke, pleural effusion and pneumonia; and, if full lymphadenectomy is performed the risk of lymphedema and lymphocyst. The patient will receive DVT and antibiotic prophylaxis as indicated. She voiced a clear understanding. She had the opportunity to ask questions. Perioperative instructions were reviewed with her. Prescriptions for post-op medications were sent to her pharmacy of choice.  A copy of this note was sent to the patient's referring provider.   70 minutes of total time was spent for this patient encounter, including preparation, face-to-face counseling with the patient and coordination of care, and documentation of the encounter.  Wiley Hanger, MD  Division of Gynecologic Oncology  Department of Obstetrics and Gynecology  [facility]  ___________________________________________  Chief Complaint: Chief Complaint  Patient presents with   Pelvic Mass    History of Present Illness:  Tracy Henry is a 48 y.o. y.o. female who is seen in consultation at the request of Dr. Harless Lien for an evaluation of a complex but mostly simple pelvic mass, abnormal uterine bleeding.   Patient has a long history of recurrent endometrial polyps. In 2011, patient underwent D&C and polypectomy.  Pathology showed benign fragments of  endometrium and pseudodecidualization of the stroma consistent with hormone effect.  No hyperplasia or malignancy. In 2016, in the setting of abnormal uterine bleeding, the patient underwent hysteroscopy with D&C, polypectomy.  Findings at the time of her surgery included multiple endometrial polyps.  Pathology revealed proliferative endometrium with simple hyperplasia without atypia, fragments of endometrial polyp.  No atypia or carcinoma noted. In 03/2019, she underwent hysteroscopy, polypectomy, and D&C.  Pathology at that time showed benign endometrial type polyp, secretory endometrium with breakdown.  No hyperplasia or malignancy noted. At the time of this surgery in 2020, outside ultrasound revealed a 2.1 cm simple left ovarian cyst.  More recently, she presented again with abnormal uterine bleeding. Pelvic ultrasound exam at [facility] on/15/25: Uterus measures 8.2 x 4.7 x 5 cm with a thickened endometrium measuring 12.3 mm.  Bilateral ovaries with PCOS appearance.  Large complex cystic mass superior and anterior to the uterus measuring 11.8 x 10.2 cm with internal layering debris and at least 1 septation. CA125: 6.7. MRI of the pelvis on 01/13/2024: Large, relatively simple fluid signal cyst in the midline pelvis measures 15 x 11.4 x 12.6 cm with a single thin septation.  Appears to arise from the left ovary although both ovaries closely abut the mass.  Ovaries otherwise normal in appearance with multiple small functional follicles bilaterally.  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  No overtly solid mass or suspicious contrast-enhancement.  No adenopathy or evidence of metastatic disease.  Recent endometrial biopsy showed EIN, some changes of suggestive but not diagnostic of well-differentiated endometrial adenocarcinoma.  Her last hemoglobin A1c in August 2024 was 6.7%.  Today, the patient reports overall doing well.  She denies ever being on any hormonal  treatment with her abnormal uterine  bleeding history.  She notes some irregular bleeding earlier this year with light bleeding in March and April although not which she considers a full menstrual cycle.  She denies any bleeding since April.  She has had several episodes, on average about once a month, of low back pain.  Typically, she has increased bleeding or will pass a clot at the time that she has this pain.  Pain last a couple of minutes.  She endorses normal bowel and bladder function.  Has had some weight gain in the last 6 months.  PAST MEDICAL HISTORY:  Past Medical History:  Diagnosis Date   Cerumen impaction 11/15/2019   Environmental allergies    Hyperlipidemia    diet controlled, no meds   IDA (iron deficiency anemia)    Morbidly obese (HCC)    Obesity, morbid, BMI 50 or higher (HCC)    Type 2 diabetes mellitus (HCC)    followed by pcp   Uterine polyp    Removed in 2010   Wears glasses      PAST SURGICAL HISTORY:  Past Surgical History:  Procedure Laterality Date   COLONOSCOPY WITH PROPOFOL  N/A 06/21/2020   Procedure: COLONOSCOPY WITH PROPOFOL ;  Surgeon: Irby Mannan, MD;  Location: [facility];  Service: Endoscopy;  Laterality: N/A;   DILATATION & CURRETTAGE/HYSTEROSCOPY WITH RESECTOCOPE N/A 04/02/2015   Procedure: DILATATION & CURETTAGE/HYSTEROSCOPY WITH RESECTOCOPE;  Surgeon: Meriam Stamp, MD;  Location: [facility];  Service: Gynecology;  Laterality: N/A;   HYSTEROSCOPY WITH NOVASURE N/A 03/22/2019   Procedure: HYSTEROSCOPY WITH POLYPECTOMY AND DILATATION AND CURRETTAGE;  Surgeon: Yalcinkaya, Tamer, MD;  Location: [facility];  Service: Gynecology;  Laterality: N/A;   HYSTEROSCOPY WITH RESECTOSCOPE  11-11-2009   dr Harless Lien @[facility]    polypectomy's   LAPAROSCOPIC CHOLECYSTECTOMY  09-28-2008   dr b. Hildy Lowers  @[facility]    TONSILLECTOMY AND ADENOIDECTOMY  1984  approx.    OB/GYN HISTORY:  OB History  Gravida Para Term Preterm AB Living  0 0 0 0 0 0  SAB IAB Ectopic Multiple Live Births  0  0 0 0 0    No LMP recorded.  Age at menarche: 68  Hx of STDs: denies Last pap: 11/2022 - NIML, HR HPV not performed History of abnormal pap smears: denies  SCREENING STUDIES:  Last mammogram: 2025  Last colonoscopy: 2021  MEDICATIONS: Outpatient Encounter Medications as of 02/10/2024  Medication Sig   rosuvastatin  (CRESTOR ) 5 MG tablet Take 1 tablet (5 mg total) by mouth daily. for cholesterol. (Patient not taking: Reported on 02/09/2024)   No facility-administered encounter medications on file as of 02/10/2024.    ALLERGIES:  No Known Allergies   FAMILY HISTORY:  Family History  Problem Relation Age of Onset   Hypertension Mother    Liver cancer Mother    Hypertension Father    Breast cancer Sister 77   Breast cancer Paternal Grandmother    Ovarian cancer Paternal Grandmother    Colon cancer Neg Hx    Pancreatic cancer Neg Hx    Prostate cancer Neg Hx    Endometrial cancer Neg Hx      SOCIAL HISTORY:  Social Connections: Moderately Integrated (05/01/2023)   Social Connection and Isolation Panel [NHANES]    Frequency of Communication with Friends and Family: More than three times a week    Frequency of Social Gatherings with Friends and Family: Once a week    Attends Religious Services: More than 4 times per  year    Active Member of Clubs or Organizations: No    Attends Engineer, structural: Not on file    Marital Status: Married    REVIEW OF SYSTEMS:  Denies appetite changes, fevers, chills, fatigue, unexplained weight changes. Denies hearing loss, neck lumps or masses, mouth sores, ringing in ears or voice changes. Denies cough or wheezing.  Denies shortness of breath. Denies chest pain or palpitations. Denies leg swelling. Denies abdominal distention, pain, blood in stools, constipation, diarrhea, nausea, vomiting, or early satiety. Denies pain with intercourse, dysuria, frequency, hematuria or incontinence. Denies hot flashes, pelvic pain, vaginal  bleeding or vaginal discharge.   Denies joint pain, back pain or muscle pain/cramps. Denies itching, rash, or wounds. Denies dizziness, headaches, numbness or seizures. Denies swollen lymph nodes or glands, denies easy bruising or bleeding. Denies anxiety, depression, confusion, or decreased concentration.  Physical Exam:  Vital Signs for this encounter:  Blood pressure 139/83, pulse 72, temperature 97.7 F (36.5 C), temperature source Oral, resp. rate 18, height 5' 2.01" (1.575 m), weight (!) 302 lb 9.6 oz (137.3 kg), SpO2 96%. Body mass index is 55.33 kg/m. General: Alert, oriented, no acute distress.  HEENT: Normocephalic, atraumatic. Sclera anicteric.  Chest: Clear to auscultation bilaterally. No wheezes, rhonchi, or rales. Cardiovascular: Regular rate and rhythm, no murmurs, rubs, or gallops.  Abdomen: Obese. Normoactive bowel sounds. Soft, nondistended, nontender to palpation. No masses or hepatosplenomegaly appreciated. No palpable fluid wave.  Extremities: Grossly normal range of motion. Warm, well perfused. No edema bilaterally.  Skin: No rashes or lesions.  Lymphatics: No cervical, supraclavicular, or inguinal adenopathy.  GU:  Normal external female genitalia. No lesions. No discharge or bleeding.             Bladder/urethra:  No lesions or masses, well supported bladder             Vagina: Well-rugated, no lesions.             Cervix: Normal appearing, no lesions.             Uterus: Small, mobile, no parametrial involvement or nodularity.             Adnexa: Somewhat limited by body habitus but there is a mass that moves in conjunction with the uterus, better palpated with the abdominal hand.   Rectal: Deferred.   LABORATORY AND RADIOLOGIC DATA:  Outside medical records were reviewed to synthesize the above history, along with the history and physical obtained during the visit.   Lab Results  Component Value Date   WBC 9.3 09/29/2021   HGB 13.0 09/29/2021   HCT 40  09/29/2021   PLT 337 09/29/2021   GLUCOSE 105 (H) 05/15/2020   CHOL 198 09/29/2021   TRIG 163 (A) 09/29/2021   HDL 36 09/29/2021   LDLCALC 29 09/29/2021   ALT 18 09/29/2021   AST 14 09/29/2021   NA 137 09/29/2021   K 4.4 09/29/2021   CL 102 09/29/2021   CREATININE 0.7 09/29/2021   BUN 15 09/29/2021   CO2 21 09/29/2021   TSH 1.71 01/09/2015   HGBA1C 6.0 (A) 03/13/2022   MICROALBUR <0.7 05/05/2023

## 2024-02-10 ENCOUNTER — Inpatient Hospital Stay: Admitting: Gynecologic Oncology

## 2024-02-10 ENCOUNTER — Inpatient Hospital Stay: Attending: Gynecologic Oncology | Admitting: Gynecologic Oncology

## 2024-02-10 ENCOUNTER — Encounter: Payer: Self-pay | Admitting: Gynecologic Oncology

## 2024-02-10 VITALS — BP 139/83 | HR 72 | Temp 97.7°F | Resp 18 | Ht 62.01 in | Wt 302.6 lb

## 2024-02-10 DIAGNOSIS — E785 Hyperlipidemia, unspecified: Secondary | ICD-10-CM | POA: Diagnosis not present

## 2024-02-10 DIAGNOSIS — Z803 Family history of malignant neoplasm of breast: Secondary | ICD-10-CM | POA: Insufficient documentation

## 2024-02-10 DIAGNOSIS — R1900 Intra-abdominal and pelvic swelling, mass and lump, unspecified site: Secondary | ICD-10-CM

## 2024-02-10 DIAGNOSIS — E119 Type 2 diabetes mellitus without complications: Secondary | ICD-10-CM | POA: Insufficient documentation

## 2024-02-10 DIAGNOSIS — N8502 Endometrial intraepithelial neoplasia [EIN]: Secondary | ICD-10-CM | POA: Diagnosis not present

## 2024-02-10 DIAGNOSIS — Z8041 Family history of malignant neoplasm of ovary: Secondary | ICD-10-CM | POA: Diagnosis not present

## 2024-02-10 DIAGNOSIS — Z79899 Other long term (current) drug therapy: Secondary | ICD-10-CM | POA: Insufficient documentation

## 2024-02-10 DIAGNOSIS — Z800 Family history of malignant neoplasm of digestive organs: Secondary | ICD-10-CM | POA: Insufficient documentation

## 2024-02-10 DIAGNOSIS — Z6841 Body Mass Index (BMI) 40.0 and over, adult: Secondary | ICD-10-CM | POA: Diagnosis not present

## 2024-02-10 DIAGNOSIS — N939 Abnormal uterine and vaginal bleeding, unspecified: Secondary | ICD-10-CM

## 2024-02-10 NOTE — Progress Notes (Signed)
 Patient here for a consult with Dr. Orvil Bland for a pre-operative appointment prior to her scheduled surgery on 02/17/2024. She is scheduled for a robotic assisted total laparoscopic hysterectomy, bilateral salpingo-oophorectomy, possible sentinel lymph node biopsy, possible lymph node dissection, possible laparotomy (larger incision on your abdomen if needed), possible staging, possible dilation and curettage of the uterus with hysteroscopy and myosure with Mirena IUD insertion. .  She had her pre-admission testing appointment this am at [facility].  The surgery was discussed in detail.  See after visit summary for additional details. Visual aids used to discuss items related to surgery including the incentive spirometer, sequential compression stockings, foley catheter, IV pump, multi-modal pain regimen including tylenol , photo of the surgical robot, female reproductive system to discuss surgery in detail.      Discussed post-op pain management in detail including the aspects of the enhanced recovery pathway.  Advised her that a new prescription would be sent in and it is only to be used for after her upcoming surgery.  We discussed the use of tylenol  post-op and to monitor for a maximum of 4,000 mg in a 24 hour period.  Also advised that sennakot will be prescribed to be used after surgery and to hold if having loose stools.  Discussed bowel regimen in detail.     Discussed the use of SCDs and measures to take at home to prevent DVT including frequent mobility.  Reportable signs and symptoms of DVT discussed. Post-operative instructions discussed and expectations for after surgery. Incisional care discussed as well including reportable signs and symptoms including erythema, drainage, wound separation.     30 minutes spent with the patient.  Verbalizing understanding of material discussed. No needs or concerns voiced at the end of the visit.   Advised patient to call for any needs.  Advised that her  post-operative medications had been prescribed and could be picked up at any time.    This appointment is included in the global surgical bundle as pre-operative teaching and has no charge.

## 2024-02-10 NOTE — Patient Instructions (Signed)
 Preparing for your Surgery  Plan for surgery on February 17, 2024 with Dr. Wiley Hanger at [facility]. You will be scheduled for a robotic assisted total laparoscopic hysterectomy (removal of uterus and cervix), bilateral salpingo-oophorectomy (removal of both fallopian tubes and ovaries), possible sentinel lymph node biopsy, possible lymph node dissection, possible laparotomy (larger incision on your abdomen if needed), possible staging, possible dilation and curettage of the uterus with hysteroscopy and myosure with Mirena IUD insertion.   Pre-operative Testing -You will receive a phone call from presurgical testing at [facility] to arrange for a pre-operative appointment and lab work.  -Bring your insurance card, copy of an advanced directive if applicable, medication list  -At that visit, you will be asked to sign a consent for a possible blood transfusion in case a transfusion becomes necessary during surgery.  The need for a blood transfusion is rare but having consent is a necessary part of your care.     -You should not be taking blood thinners or aspirin at least ten days prior to surgery unless instructed by your surgeon.  -Do not take supplements such as fish oil (omega 3), red yeast rice, turmeric before your surgery. STOP TAKING AT LEAST 10 DAYS BEFORE SURGERY. You want to avoid medications with aspirin in them including headache powders such as BC or Goody's), Excedrin migraine.  Day Before Surgery at Home -You will be asked to take in a light diet the day before surgery. You will be advised you can have clear liquids up until 3 hours before your surgery.    Eat a light diet the day before surgery.  Examples including soups, broths, toast, yogurt, mashed potatoes.  AVOID GAS PRODUCING FOODS AND BEVERAGES. Things to avoid include carbonated beverages (fizzy beverages, sodas), raw fruits and raw vegetables (uncooked), or beans.   If your bowels are filled with gas,  your surgeon will have difficulty visualizing your pelvic organs which increases your surgical risks.  Your role in recovery Your role is to become active as soon as directed by your doctor, while still giving yourself time to heal.  Rest when you feel tired. You will be asked to do the following in order to speed your recovery:  - Cough and breathe deeply. This helps to clear and expand your lungs and can prevent pneumonia after surgery.  - STAY ACTIVE WHEN YOU GET HOME. Do mild physical activity. Walking or moving your legs help your circulation and body functions return to normal. Do not try to get up or walk alone the first time after surgery.   -If you develop swelling on one leg or the other, pain in the back of your leg, redness/warmth in one of your legs, please call the office or go to the Emergency Room to have a doppler to rule out a blood clot. For shortness of breath, chest pain-seek care in the Emergency Room as soon as possible. - Actively manage your pain. Managing your pain lets you move in comfort. We will ask you to rate your pain on a scale of zero to 10. It is your responsibility to tell your doctor or nurse where and how much you hurt so your pain can be treated.  Special Considerations -If you are diabetic, you may be placed on insulin after surgery to have closer control over your blood sugars to promote healing and recovery.  This does not mean that you will be discharged on insulin.  If applicable, your oral antidiabetics will  be resumed when you are tolerating a solid diet.  -Your final pathology results from surgery should be available around one week after surgery and the results will be relayed to you when available.  -FMLA forms can be faxed to [phone] and please allow 5-7 business days for completion.  Pain Management After Surgery -You will be prescribed your pain medication and bowel regimen medications before surgery so that you can have these available when  you are discharged from the hospital. The pain medication is for use ONLY AFTER surgery and a new prescription will not be given.   -Make sure that you have Tylenol  and Ibuprofen  IF YOU ARE ABLE TO TAKE THESE MEDICATIONS at home to use on a regular basis after surgery for pain control. We recommend alternating the medications every hour to six hours since they work differently and are processed in the body differently for pain relief.  -Review the attached handout on narcotic use and their risks and side effects.   Bowel Regimen -You will be prescribed Sennakot-S to take nightly to prevent constipation especially if you are taking the narcotic pain medication intermittently.  It is important to prevent constipation and drink adequate amounts of liquids. You can stop taking this medication when you are not taking pain medication and you are back on your normal bowel routine.  Risks of Surgery Risks of surgery are low but include bleeding, infection, damage to surrounding structures, re-operation, blood clots, and very rarely death.   Blood Transfusion Information (For the consent to be signed before surgery)  We will be checking your blood type before surgery so in case of emergencies, we will know what type of blood you would need.                                            WHAT IS A BLOOD TRANSFUSION?  A transfusion is the replacement of blood or some of its parts. Blood is made up of multiple cells which provide different functions. Red blood cells carry oxygen and are used for blood loss replacement. White blood cells fight against infection. Platelets control bleeding. Plasma helps clot blood. Other blood products are available for specialized needs, such as hemophilia or other clotting disorders. BEFORE THE TRANSFUSION  Who gives blood for transfusions?  You may be able to donate blood to be used at a later date on yourself (autologous donation). Relatives can be asked to donate  blood. This is generally not any safer than if you have received blood from a stranger. The same precautions are taken to ensure safety when a relative's blood is donated. Healthy volunteers who are fully evaluated to make sure their blood is safe. This is blood bank blood. Transfusion therapy is the safest it has ever been in the practice of medicine. Before blood is taken from a donor, a complete history is taken to make sure that person has no history of diseases nor engages in risky social behavior (examples are intravenous drug use or sexual activity with multiple partners). The donor's travel history is screened to minimize risk of transmitting infections, such as malaria. The donated blood is tested for signs of infectious diseases, such as HIV and hepatitis. The blood is then tested to be sure it is compatible with you in order to minimize the chance of a transfusion reaction. If you or a relative donates blood, this  is often done in anticipation of surgery and is not appropriate for emergency situations. It takes many days to process the donated blood. RISKS AND COMPLICATIONS Although transfusion therapy is very safe and saves many lives, the main dangers of transfusion include:  Getting an infectious disease. Developing a transfusion reaction. This is an allergic reaction to something in the blood you were given. Every precaution is taken to prevent this. The decision to have a blood transfusion has been considered carefully by your caregiver before blood is given. Blood is not given unless the benefits outweigh the risks.  AFTER SURGERY INSTRUCTIONS  Return to work: 4-6 weeks if applicable  Activity: 1. Be up and out of the bed during the day.  Take a nap if needed.  You may walk up steps but be careful and use the hand rail.  Stair climbing will tire you more than you think, you may need to stop part way and rest.   2. No lifting or straining for 6 weeks over 10 pounds. No pushing,  pulling, straining for 6 weeks.  3. No driving for 0-98 days when the following criteria have been met: Do not drive if you are taking narcotic pain medicine and make sure that your reaction time has returned.   4. You can shower as soon as the next day after surgery. Shower daily.  Use your regular soap and water (not directly on the incision) and pat your incision(s) dry afterwards; don't rub.  No tub baths or submerging your body in water until cleared by your surgeon. If you have the soap that was given to you by pre-surgical testing that was used before surgery, you do not need to use it afterwards because this can irritate your incisions.   5. No sexual activity and nothing in the vagina for 12 weeks.  6. You may experience a small amount of clear drainage from your incisions, which is normal.  If the drainage persists, increases, or changes color please call the office.  7. Do not use creams, lotions, or ointments such as neosporin on your incisions after surgery until advised by your surgeon because they can cause removal of the dermabond glue on your incisions.    8. You may experience vaginal spotting after surgery or when the stitches at the top of the vagina begin to dissolve.  The spotting is normal but if you experience heavy bleeding, call our office.  9. Take Tylenol  or ibuprofen  first for pain if you are able to take these medications and only use narcotic pain medication for severe pain not relieved by the Tylenol  or Ibuprofen .  Monitor your Tylenol  intake to a max of 4,000 mg in a 24 hour period. You can alternate these medications after surgery.  Diet: 1. Low sodium Heart Healthy Diet is recommended but you are cleared to resume your normal (before surgery) diet after your procedure.  2. It is safe to use a laxative, such as Miralax or Colace, if you have difficulty moving your bowels before surgery. You have been prescribed Sennakot-S to take at bedtime every evening after  surgery to keep bowel movements regular and to prevent constipation.    Wound Care: 1. Keep clean and dry.  Shower daily.  Reasons to call the Doctor: Fever - Oral temperature greater than 100.4 degrees Fahrenheit Foul-smelling vaginal discharge Difficulty urinating Nausea and vomiting Increased pain at the site of the incision that is unrelieved with pain medicine. Difficulty breathing with or without chest pain New  calf pain especially if only on one side Sudden, continuing increased vaginal bleeding with or without clots.   Contacts: For questions or concerns you should contact:  Dr. Wiley Hanger at [phone]  Vira Grieves, NP at [phone]  After Hours: call [phone] and have the GYN Oncologist paged/contacted (after 5 pm or on the weekends). You will speak with an after hours RN and let he or she know you have had surgery.  Messages sent via mychart are for non-urgent matters and are not responded to after hours so for urgent needs, please call the after hours number.

## 2024-02-15 NOTE — Patient Instructions (Signed)
 SURGICAL WAITING ROOM VISITATION  Patients having surgery or a procedure may have no more than 2 support people in the waiting area - these visitors may rotate.    Children under the age of 73 must have an adult with them who is not the patient.  Due to an increase in RSV and influenza rates and associated hospitalizations, children ages 54 and under may not visit patients in [facility] hospitals.  Visitors with respiratory illnesses are discouraged from visiting and should remain at home.  If the patient needs to stay at the hospital during part of their recovery, the visitor guidelines for inpatient rooms apply. Pre-op nurse will coordinate an appropriate time for 1 support person to accompany patient in pre-op.  This support person may not rotate.    Please refer to the [facility] website for the visitor guidelines for Inpatients (after your surgery is over and you are in a regular room).    Your procedure is scheduled on: 02/17/24   Report to [facility] Main Entrance    Report to admitting at 12:45 PM   Call this number if you have problems the morning of surgery [phone]   Do not eat food :After Midnight.   After Midnight you may have the following liquids until 12:00 PM DAY OF SURGERY  Water Non-Citrus Juices (without pulp, NO RED-Apple, White grape, White cranberry) Black Coffee (NO MILK/CREAM OR CREAMERS, sugar ok)  Clear Tea (NO MILK/CREAM OR CREAMERS, sugar ok) regular and decaf                             Plain Jell-O (NO RED)                                           Fruit ices (not with fruit pulp, NO RED)                                     Popsicles (NO RED)                                                               Sports drinks like Gatorade (NO RED)                      If you have questions, please contact your surgeon's office.   FOLLOW BOWEL PREP AND ANY ADDITIONAL PRE OP INSTRUCTIONS YOU RECEIVED FROM YOUR SURGEON'S OFFICE!!!     Oral  Hygiene is also important to reduce your risk of infection.                                    Remember - BRUSH YOUR TEETH THE MORNING OF SURGERY WITH YOUR REGULAR TOOTHPASTE  DENTURES WILL BE REMOVED PRIOR TO SURGERY PLEASE DO NOT APPLY "Poly grip" OR ADHESIVES!!!   Stop all vitamins and herbal supplements 7 days before surgery.   Take these medicines the morning of surgery with A SIP OF WATER: None  DO NOT TAKE ANY ORAL DIABETIC MEDICATIONS DAY OF YOUR SURGERY  How to Manage Your Diabetes Before and After Surgery  Why is it important to control my blood sugar before and after surgery? Improving blood sugar levels before and after surgery helps healing and can limit problems. A way of improving blood sugar control is eating a healthy diet by:  Eating less sugar and carbohydrates  Increasing activity/exercise  Talking with your doctor about reaching your blood sugar goals High blood sugars (greater than 180 mg/dL) can raise your risk of infections and slow your recovery, so you will need to focus on controlling your diabetes during the weeks before surgery. Make sure that the doctor who takes care of your diabetes knows about your planned surgery including the date and location.  How do I manage my blood sugar before surgery? Check your blood sugar at least 4 times a day, starting 2 days before surgery, to make sure that the level is not too high or low. Check your blood sugar the morning of your surgery when you wake up and every 2 hours until you get to the Short Stay unit. If your blood sugar is less than 70 mg/dL, you will need to treat for low blood sugar: Do not take insulin. Treat a low blood sugar (less than 70 mg/dL) with  cup of clear juice (cranberry or apple), 4 glucose tablets, OR glucose gel. Recheck blood sugar in 15 minutes after treatment (to make sure it is greater than 70 mg/dL). If your blood sugar is not greater than 70 mg/dL on recheck, call [phone] for  further instructions. Report your blood sugar to the short stay nurse when you get to Short Stay.  If you are admitted to the hospital after surgery: Your blood sugar will be checked by the staff and you will probably be given insulin after surgery (instead of oral diabetes medicines) to make sure you have good blood sugar levels. The goal for blood sugar control after surgery is 80-180 mg/dL.  Reviewed and Endorsed by [facility] Patient Education Committee, August 2015                              You may not have any metal on your body including hair pins, jewelry, and body piercing             Do not wear make-up, lotions, powders, perfumes, or deodorant  Do not wear nail polish including gel and S&S, artificial/acrylic nails, or any other type of covering on natural nails including finger and toenails. If you have artificial nails, gel coating, etc. that needs to be removed by a nail salon please have this removed prior to surgery or surgery may need to be canceled/ delayed if the surgeon/ anesthesia feels like they are unable to be safely monitored.   Do not shave  48 hours prior to surgery.    Do not bring valuables to the hospital. [facility] IS NOT             RESPONSIBLE   FOR VALUABLES.   Contacts, glasses, dentures or bridgework may not be worn into surgery.  DO NOT BRING YOUR HOME MEDICATIONS TO THE HOSPITAL. PHARMACY WILL DISPENSE MEDICATIONS LISTED ON YOUR MEDICATION LIST TO YOU DURING YOUR ADMISSION IN THE HOSPITAL!    Patients discharged on the day of surgery will not be allowed to drive home.  Someone NEEDS to stay with you  for the first 24 hours after anesthesia.              Please read over the following fact sheets you were given: IF YOU HAVE QUESTIONS ABOUT YOUR PRE-OP INSTRUCTIONS PLEASE CALL [name]   If you received a COVID test during your pre-op visit  it is requested that you wear a mask when out in public, stay away from anyone that may not be  feeling well and notify your surgeon if you develop symptoms. If you test positive for Covid or have been in contact with anyone that has tested positive in the last 10 days please notify you surgeon.    [facility] - Preparing for Surgery Before surgery, you can play an important role.  Because skin is not sterile, your skin needs to be as free of germs as possible.  You can reduce the number of germs on your skin by washing with CHG (chlorahexidine gluconate) soap before surgery.  CHG is an antiseptic cleaner which kills germs and bonds with the skin to continue killing germs even after washing. Please DO NOT use if you have an allergy to CHG or antibacterial soaps.  If your skin becomes reddened/irritated stop using the CHG and inform your nurse when you arrive at Short Stay. Do not shave (including legs and underarms) for at least 48 hours prior to the first CHG shower.  You may shave your face/neck.  Please follow these instructions carefully:  1.  Shower with CHG Soap the night before surgery and the  morning of surgery.  2.  If you choose to wash your hair, wash your hair first as usual with your normal  shampoo.  3.  After you shampoo, rinse your hair and body thoroughly to remove the shampoo.                             4.  Use CHG as you would any other liquid soap.  You can apply chg directly to the skin and wash.  Gently with a scrungie or clean washcloth.  5.  Apply the CHG Soap to your body ONLY FROM THE NECK DOWN.   Do   not use on face/ open                           Wound or open sores. Avoid contact with eyes, ears mouth and   genitals (private parts).                       Wash face,  Genitals (private parts) with your normal soap.             6.  Wash thoroughly, paying special attention to the area where your    surgery  will be performed.  7.  Thoroughly rinse your body with warm water from the neck down.  8.  DO NOT shower/wash with your normal soap after using and rinsing off  the CHG Soap.                9.  Pat yourself dry with a clean towel.            10.  Wear clean pajamas.            11.  Place clean sheets on your bed the night of your first shower and do not  sleep with pets. Day  of Surgery : Do not apply any lotions/deodorants the morning of surgery.  Please wear clean clothes to the hospital/surgery center.  FAILURE TO FOLLOW THESE INSTRUCTIONS MAY RESULT IN THE CANCELLATION OF YOUR SURGERY  PATIENT SIGNATURE_________________________________  NURSE SIGNATURE__________________________________  ________________________________________________________________________ WHAT IS A BLOOD TRANSFUSION? Blood Transfusion Information  A transfusion is the replacement of blood or some of its parts. Blood is made up of multiple cells which provide different functions. Red blood cells carry oxygen and are used for blood loss replacement. White blood cells fight against infection. Platelets control bleeding. Plasma helps clot blood. Other blood products are available for specialized needs, such as hemophilia or other clotting disorders. BEFORE THE TRANSFUSION  Who gives blood for transfusions?  Healthy volunteers who are fully evaluated to make sure their blood is safe. This is blood bank blood. Transfusion therapy is the safest it has ever been in the practice of medicine. Before blood is taken from a donor, a complete history is taken to make sure that person has no history of diseases nor engages in risky social behavior (examples are intravenous drug use or sexual activity with multiple partners). The donor's travel history is screened to minimize risk of transmitting infections, such as malaria. The donated blood is tested for signs of infectious diseases, such as HIV and hepatitis. The blood is then tested to be sure it is compatible with you in order to minimize the chance of a transfusion reaction. If you or a relative donates blood, this is often done in  anticipation of surgery and is not appropriate for emergency situations. It takes many days to process the donated blood. RISKS AND COMPLICATIONS Although transfusion therapy is very safe and saves many lives, the main dangers of transfusion include:  Getting an infectious disease. Developing a transfusion reaction. This is an allergic reaction to something in the blood you were given. Every precaution is taken to prevent this. The decision to have a blood transfusion has been considered carefully by your caregiver before blood is given. Blood is not given unless the benefits outweigh the risks. AFTER THE TRANSFUSION Right after receiving a blood transfusion, you will usually feel much better and more energetic. This is especially true if your red blood cells have gotten low (anemic). The transfusion raises the level of the red blood cells which carry oxygen, and this usually causes an energy increase. The nurse administering the transfusion will monitor you carefully for complications. HOME CARE INSTRUCTIONS  No special instructions are needed after a transfusion. You may find your energy is better. Speak with your caregiver about any limitations on activity for underlying diseases you may have. SEEK MEDICAL CARE IF:  Your condition is not improving after your transfusion. You develop redness or irritation at the intravenous (IV) site. SEEK IMMEDIATE MEDICAL CARE IF:  Any of the following symptoms occur over the next 12 hours: Shaking chills. You have a temperature by mouth above 102 F (38.9 C), not controlled by medicine. Chest, back, or muscle pain. People around you feel you are not acting correctly or are confused. Shortness of breath or difficulty breathing. Dizziness and fainting. You get a rash or develop hives. You have a decrease in urine output. Your urine turns a dark color or changes to pink, red, or brown. Any of the following symptoms occur over the next 10 days: You have a  temperature by mouth above 102 F (38.9 C), not controlled by medicine. Shortness of breath. Weakness after normal activity. The white part  of the eye turns yellow (jaundice). You have a decrease in the amount of urine or are urinating less often. Your urine turns a dark color or changes to pink, red, or brown. Document Released: 08/28/2000 Document Revised: 11/23/2011 Document Reviewed: 04/16/2008 [facility] Patient Information 2014 [place], [place].  _______________________________________________________________________

## 2024-02-15 NOTE — Progress Notes (Signed)
 COVID Vaccine Completed: yes  Date of COVID positive in last 90 days:  PCP - Tretha Fu, NP Cardiologist - n/a  Chest x-ray - n/a EKG - 02/16/24 Epic/chart Stress Test - n/a ECHO - n/a Cardiac Cath - n/a Pacemaker/ICD device last checked: n/a Spinal Cord Stimulator: n/a  Bowel Prep - light diet day before  Sleep Study - n/a CPAP -   Fasting Blood Sugar - preDM per pt Checks Blood Sugar  no checks or meds  Last dose of GLP1 agonist-  N/A GLP1 instructions:  Hold 7 days before surgery    Last dose of SGLT-2 inhibitors-  N/A SGLT-2 instructions:  Hold 3 days before surgery    Blood Thinner Instructions:  Last dose:  N/a Time: Aspirin Instructions: Last Dose:  Activity level: Can go up a flight of stairs and perform activities of daily living without stopping and without symptoms of chest pain or shortness of breath.  Anesthesia review:   Patient denies shortness of breath, fever, cough and chest pain at PAT appointment  Patient verbalized understanding of instructions that were given to them at the PAT appointment. Patient was also instructed that they will need to review over the PAT instructions again at home before surgery.

## 2024-02-16 ENCOUNTER — Encounter (HOSPITAL_COMMUNITY): Payer: Self-pay

## 2024-02-16 ENCOUNTER — Telehealth: Payer: Self-pay | Admitting: *Deleted

## 2024-02-16 ENCOUNTER — Other Ambulatory Visit: Payer: Self-pay

## 2024-02-16 ENCOUNTER — Other Ambulatory Visit: Payer: Self-pay | Admitting: Gynecologic Oncology

## 2024-02-16 ENCOUNTER — Telehealth: Payer: Self-pay

## 2024-02-16 ENCOUNTER — Encounter (HOSPITAL_COMMUNITY)
Admission: RE | Admit: 2024-02-16 | Discharge: 2024-02-16 | Disposition: A | Discharge status: Home / Self Care | Source: Ambulatory Visit | Attending: Gynecologic Oncology | Admitting: Gynecologic Oncology

## 2024-02-16 VITALS — BP 149/87 | HR 81 | Temp 98.0°F | Resp 16 | Ht 61.0 in | Wt 299.0 lb

## 2024-02-16 DIAGNOSIS — Z01818 Encounter for other preprocedural examination: Secondary | ICD-10-CM | POA: Diagnosis not present

## 2024-02-16 DIAGNOSIS — N8502 Endometrial intraepithelial neoplasia [EIN]: Secondary | ICD-10-CM

## 2024-02-16 DIAGNOSIS — R9431 Abnormal electrocardiogram [ECG] [EKG]: Secondary | ICD-10-CM | POA: Diagnosis not present

## 2024-02-16 DIAGNOSIS — N939 Abnormal uterine and vaginal bleeding, unspecified: Secondary | ICD-10-CM

## 2024-02-16 DIAGNOSIS — R1900 Intra-abdominal and pelvic swelling, mass and lump, unspecified site: Secondary | ICD-10-CM | POA: Diagnosis not present

## 2024-02-16 DIAGNOSIS — Z01810 Encounter for preprocedural cardiovascular examination: Secondary | ICD-10-CM | POA: Diagnosis present

## 2024-02-16 DIAGNOSIS — E1165 Type 2 diabetes mellitus with hyperglycemia: Secondary | ICD-10-CM | POA: Diagnosis not present

## 2024-02-16 DIAGNOSIS — Z01812 Encounter for preprocedural laboratory examination: Secondary | ICD-10-CM | POA: Diagnosis present

## 2024-02-16 LAB — COMPREHENSIVE METABOLIC PANEL WITH GFR
ALT: 20 U/L (ref 0–44)
AST: 19 U/L (ref 15–41)
Albumin: 3.6 g/dL (ref 3.5–5.0)
Alkaline Phosphatase: 74 U/L (ref 38–126)
Anion gap: 9 (ref 5–15)
BUN: 10 mg/dL (ref 6–20)
CO2: 23 mmol/L (ref 22–32)
Calcium: 8.9 mg/dL (ref 8.9–10.3)
Chloride: 104 mmol/L (ref 98–111)
Creatinine, Ser: 0.66 mg/dL (ref 0.44–1.00)
GFR, Estimated: >60 mL/min (ref >60)
Glucose, Bld: 135 mg/dL — ABNORMAL HIGH (ref 70–99)
Potassium: 3.9 mmol/L (ref 3.5–5.1)
Sodium: 136 mmol/L (ref 135–145)
Total Bilirubin: 0.6 mg/dL (ref 0.0–1.2)
Total Protein: 7.6 g/dL (ref 6.5–8.1)

## 2024-02-16 LAB — CBC
HCT: 44.5 % (ref 36.0–46.0)
Hemoglobin: 13.8 g/dL (ref 12.0–15.0)
MCH: 26.5 pg (ref 26.0–34.0)
MCHC: 31 g/dL (ref 30.0–36.0)
MCV: 85.6 fL (ref 80.0–100.0)
Platelets: 331 10*3/uL (ref 150–400)
RBC: 5.2 MIL/uL — ABNORMAL HIGH (ref 3.87–5.11)
RDW: 14.6 % (ref 11.5–15.5)
WBC: 10.4 10*3/uL (ref 4.0–10.5)
nRBC: 0 % (ref 0.0–0.2)

## 2024-02-16 LAB — GLUCOSE, CAPILLARY: Glucose-Capillary: 138 mg/dL — ABNORMAL HIGH (ref 70–99)

## 2024-02-16 LAB — HEMOGLOBIN A1C
Hgb A1c MFr Bld: 6.6 % — ABNORMAL HIGH (ref 4.8–5.6)
Mean Plasma Glucose: 142.72 mg/dL

## 2024-02-16 MED ORDER — OXYCODONE HCL 5 MG PO TABS: 5.0000 mg | ORAL_TABLET | ORAL | 0 refills | Status: DC | PRN

## 2024-02-16 MED ORDER — SENNOSIDES-DOCUSATE SODIUM 8.6-50 MG PO TABS
2.0000 | ORAL_TABLET | Freq: Every day | ORAL | 0 refills | Status: DC
Start: 2024-02-16 — End: 2024-03-24

## 2024-02-16 MED ORDER — IBUPROFEN 800 MG PO TABS
800.0000 mg | ORAL_TABLET | Freq: Three times a day (TID) | ORAL | 0 refills | Status: DC | PRN
Start: 2024-02-16 — End: 2024-03-24

## 2024-02-16 NOTE — Telephone Encounter (Signed)
Telephone call to check on pre-operative status.  Patient compliant with pre-operative instructions.  Reinforced nothing to eat after midnight. Clear liquids until 1145. Patient to arrive at 1245.  No questions or concerns voiced.  Instructed to call for any needs. 

## 2024-02-16 NOTE — Anesthesia Preprocedure Evaluation (Signed)
 Anesthesia Evaluation  Patient identified by MRN, date of birth, ID band Patient awake    Reviewed: Allergy & Precautions, H&P , NPO status , Patient's Chart, lab work & pertinent test results  History of Anesthesia Complications Negative for: history of anesthetic complications  Airway Mallampati: IV  TM Distance: >3 FB Neck ROM: Limited    Dental  (+) Dental Advisory Given, Teeth Intact   Pulmonary neg pulmonary ROS   Pulmonary exam normal breath sounds clear to auscultation       Cardiovascular Exercise Tolerance: Good negative cardio ROS Normal cardiovascular exam Rhythm:Regular Rate:Normal     Neuro/Psych negative neurological ROS  negative psych ROS   GI/Hepatic Neg liver ROS,GERD  ,,  Endo/Other  diabetes  Class 4 obesity  Renal/GU negative Renal ROS     Musculoskeletal   Abdominal  (+) + obese  Peds  Hematology  (+) Blood dyscrasia, anemia   Anesthesia Other Findings   Reproductive/Obstetrics                             Anesthesia Physical Anesthesia Plan  ASA: 3  Anesthesia Plan: General   Post-op Pain Management: Tylenol  PO (pre-op)*, Gabapentin PO (pre-op)* and Toradol  IV (intra-op)*   Induction: Intravenous  PONV Risk Score and Plan: 4 or greater and Midazolam , Treatment may vary due to age or medical condition, Ondansetron  and Dexamethasone   Airway Management Planned: Oral ETT and Video Laryngoscope Planned  Additional Equipment: None  Intra-op Plan:   Post-operative Plan: Extubation in OR  Informed Consent: I have reviewed the patients History and Physical, chart, labs and discussed the procedure including the risks, benefits and alternatives for the proposed anesthesia with the patient or authorized representative who has indicated his/her understanding and acceptance.     Dental advisory given  Plan Discussed with: CRNA  Anesthesia Plan Comments: (2 x  PIV  Risks of anesthesia explained at length. This includes, but is not limited to, sore throat, damage to teeth, lips gums, tongue and vocal cords, nausea and vomiting, reactions to medications, stroke, heart attack, and death. All patient questions were answered and the patient wishes to proceed. )        Anesthesia Quick Evaluation

## 2024-02-16 NOTE — Telephone Encounter (Signed)
 FMLA forms received from [employer]. Requested information provided and faxed back.   Pt is aware

## 2024-02-16 NOTE — Telephone Encounter (Signed)
 Copied from CRM [id number]. Topic: Clinical - Medication Prior Auth >> Feb 16, 2024  3:05 PM Lajean Pike wrote: Reason for CRM: April, [phone], from [facility] called in to see if Surgical Authorization was received that was sent over on May 29th due to patient being scheduled for surgery tomorrow.

## 2024-02-16 NOTE — Telephone Encounter (Signed)
 Advised Tracy Henry that we have not received a surgical clearance form for this patient. She is refaxing to our office and I have alerted front staff to be on the lookout for it. Her surgery is tomorrow and she is requesting this be completed today.

## 2024-02-17 ENCOUNTER — Encounter (HOSPITAL_COMMUNITY): Payer: Self-pay | Admitting: Gynecologic Oncology

## 2024-02-17 ENCOUNTER — Ambulatory Visit (HOSPITAL_COMMUNITY): Payer: Self-pay

## 2024-02-17 ENCOUNTER — Encounter (HOSPITAL_COMMUNITY)
Admission: RE | Disposition: A | Discharge status: Home / Self Care | Payer: Self-pay | Source: Home / Self Care | Attending: Gynecologic Oncology

## 2024-02-17 ENCOUNTER — Observation Stay (HOSPITAL_COMMUNITY)
Admission: RE | Admit: 2024-02-17 | Discharge: 2024-02-18 | Disposition: A | Discharge status: Home / Self Care | Attending: Gynecologic Oncology | Admitting: Gynecologic Oncology

## 2024-02-17 ENCOUNTER — Other Ambulatory Visit: Payer: Self-pay

## 2024-02-17 DIAGNOSIS — N8502 Endometrial intraepithelial neoplasia [EIN]: Principal | ICD-10-CM | POA: Diagnosis present

## 2024-02-17 DIAGNOSIS — R978 Other abnormal tumor markers: Secondary | ICD-10-CM | POA: Insufficient documentation

## 2024-02-17 DIAGNOSIS — E785 Hyperlipidemia, unspecified: Secondary | ICD-10-CM

## 2024-02-17 DIAGNOSIS — E119 Type 2 diabetes mellitus without complications: Secondary | ICD-10-CM | POA: Insufficient documentation

## 2024-02-17 DIAGNOSIS — Z6841 Body Mass Index (BMI) 40.0 and over, adult: Secondary | ICD-10-CM

## 2024-02-17 DIAGNOSIS — R1909 Other intra-abdominal and pelvic swelling, mass and lump: Secondary | ICD-10-CM | POA: Insufficient documentation

## 2024-02-17 DIAGNOSIS — L728 Other follicular cysts of the skin and subcutaneous tissue: Secondary | ICD-10-CM | POA: Insufficient documentation

## 2024-02-17 DIAGNOSIS — E1165 Type 2 diabetes mellitus with hyperglycemia: Secondary | ICD-10-CM

## 2024-02-17 DIAGNOSIS — R000 Tachycardia, unspecified: Secondary | ICD-10-CM | POA: Insufficient documentation

## 2024-02-17 DIAGNOSIS — R0902 Hypoxemia: Secondary | ICD-10-CM | POA: Insufficient documentation

## 2024-02-17 DIAGNOSIS — R1900 Intra-abdominal and pelvic swelling, mass and lump, unspecified site: Secondary | ICD-10-CM | POA: Diagnosis present

## 2024-02-17 DIAGNOSIS — Z9889 Other specified postprocedural states: Secondary | ICD-10-CM

## 2024-02-17 HISTORY — PX: ROBOTIC ASSISTED TOTAL HYSTERECTOMY WITH BILATERAL SALPINGO OOPHERECTOMY: SHX6086

## 2024-02-17 HISTORY — PX: CYSTOSCOPY: SHX5120

## 2024-02-17 LAB — TYPE AND SCREEN
ABO/RH(D): A POS
Antibody Screen: NEGATIVE

## 2024-02-17 LAB — GLUCOSE, CAPILLARY
Glucose-Capillary: 121 mg/dL — ABNORMAL HIGH (ref 70–99)
Glucose-Capillary: 157 mg/dL — ABNORMAL HIGH (ref 70–99)

## 2024-02-17 LAB — POCT PREGNANCY, URINE: Preg Test, Ur: NEGATIVE

## 2024-02-17 SURGERY — HYSTERECTOMY, TOTAL, ROBOT-ASSISTED, LAPAROSCOPIC, WITH BILATERAL SALPINGO-OOPHORECTOMY
Anesthesia: General

## 2024-02-17 MED ORDER — OXYCODONE HCL 5 MG PO TABS
5.0000 mg | ORAL_TABLET | ORAL | Status: DC | PRN
  Filled 2024-02-17: qty 1

## 2024-02-17 MED ORDER — GABAPENTIN 300 MG PO CAPS
300.0000 mg | ORAL_CAPSULE | Freq: Once | ORAL | Status: AC
  Administered 2024-02-17: 300 mg via ORAL
  Filled 2024-02-17: qty 1

## 2024-02-17 MED ORDER — INSULIN ASPART 100 UNIT/ML IJ SOLN: 0.0000 [IU] | INTRAMUSCULAR | Status: DC | PRN

## 2024-02-17 MED ORDER — POVIDONE-IODINE 10 % EX SWAB: 2.0000 | Freq: Once | CUTANEOUS | Status: DC

## 2024-02-17 MED ORDER — BUPIVACAINE HCL (PF) 0.25 % IJ SOLN
INTRAMUSCULAR | Status: AC
  Filled 2024-02-17: qty 30

## 2024-02-17 MED ORDER — ONDANSETRON HCL 4 MG PO TABS: 4.0000 mg | ORAL_TABLET | Freq: Four times a day (QID) | ORAL | Status: DC | PRN

## 2024-02-17 MED ORDER — PROPOFOL 10 MG/ML IV BOLUS
INTRAVENOUS | Status: AC
  Filled 2024-02-17: qty 20

## 2024-02-17 MED ORDER — STERILE WATER FOR IRRIGATION IR SOLN
Status: DC | PRN
Start: 2024-02-17 — End: 2024-02-17
  Administered 2024-02-17: 1000 mL

## 2024-02-17 MED ORDER — ROCURONIUM BROMIDE 10 MG/ML (PF) SYRINGE
PREFILLED_SYRINGE | INTRAVENOUS | Status: AC
  Filled 2024-02-17: qty 10

## 2024-02-17 MED ORDER — SUCCINYLCHOLINE CHLORIDE 200 MG/10ML IV SOSY
PREFILLED_SYRINGE | INTRAVENOUS | Status: DC | PRN
  Administered 2024-02-17: 140 mg via INTRAVENOUS

## 2024-02-17 MED ORDER — ONDANSETRON HCL 4 MG/2ML IJ SOLN: 4.0000 mg | Freq: Four times a day (QID) | INTRAMUSCULAR | Status: DC | PRN

## 2024-02-17 MED ORDER — MIDAZOLAM HCL 2 MG/2ML IJ SOLN
INTRAMUSCULAR | Status: AC
  Filled 2024-02-17: qty 2

## 2024-02-17 MED ORDER — HEMOSTATIC AGENTS (NO CHARGE) OPTIME
TOPICAL | Status: DC | PRN
  Administered 2024-02-17: 1 via TOPICAL

## 2024-02-17 MED ORDER — STERILE WATER FOR INJECTION IJ SOLN
INTRAMUSCULAR | Status: AC
  Filled 2024-02-17: qty 10

## 2024-02-17 MED ORDER — LABETALOL HCL 5 MG/ML IV SOLN
INTRAVENOUS | Status: DC | PRN
  Administered 2024-02-17: 4 mg via INTRAVENOUS
  Administered 2024-02-17: 2 mg via INTRAVENOUS
  Administered 2024-02-17: 4 mg via INTRAVENOUS

## 2024-02-17 MED ORDER — ONDANSETRON HCL 4 MG/2ML IJ SOLN
INTRAMUSCULAR | Status: AC
  Filled 2024-02-17: qty 2

## 2024-02-17 MED ORDER — HYDROMORPHONE HCL 1 MG/ML IJ SOLN
0.2500 mg | INTRAMUSCULAR | Status: DC | PRN
  Administered 2024-02-17 (×2): 0.5 mg via INTRAVENOUS

## 2024-02-17 MED ORDER — SIMETHICONE 80 MG PO CHEW: 80.0000 mg | CHEWABLE_TABLET | Freq: Four times a day (QID) | ORAL | Status: DC | PRN

## 2024-02-17 MED ORDER — HYDROMORPHONE HCL 1 MG/ML IJ SOLN
INTRAMUSCULAR | Status: AC
  Filled 2024-02-17: qty 1

## 2024-02-17 MED ORDER — SUCCINYLCHOLINE CHLORIDE 200 MG/10ML IV SOSY
PREFILLED_SYRINGE | INTRAVENOUS | Status: AC
  Filled 2024-02-17: qty 10

## 2024-02-17 MED ORDER — ROCURONIUM BROMIDE 10 MG/ML (PF) SYRINGE
PREFILLED_SYRINGE | INTRAVENOUS | Status: DC | PRN
  Administered 2024-02-17: 20 mg via INTRAVENOUS
  Administered 2024-02-17: 50 mg via INTRAVENOUS

## 2024-02-17 MED ORDER — SENNOSIDES-DOCUSATE SODIUM 8.6-50 MG PO TABS: 1.0000 | ORAL_TABLET | Freq: Every evening | ORAL | Status: DC | PRN

## 2024-02-17 MED ORDER — CHLORHEXIDINE GLUCONATE 0.12 % MT SOLN
15.0000 mL | Freq: Once | OROMUCOSAL | Status: AC
  Administered 2024-02-17: 15 mL via OROMUCOSAL

## 2024-02-17 MED ORDER — PHENYLEPHRINE 80 MCG/ML (10ML) SYRINGE FOR IV PUSH (FOR BLOOD PRESSURE SUPPORT)
PREFILLED_SYRINGE | INTRAVENOUS | Status: DC | PRN
  Administered 2024-02-17: 160 ug via INTRAVENOUS

## 2024-02-17 MED ORDER — ONDANSETRON HCL 4 MG/2ML IJ SOLN
INTRAMUSCULAR | Status: DC | PRN
  Administered 2024-02-17: 4 mg via INTRAVENOUS

## 2024-02-17 MED ORDER — FENTANYL CITRATE (PF) 250 MCG/5ML IJ SOLN
INTRAMUSCULAR | Status: AC
  Filled 2024-02-17: qty 5

## 2024-02-17 MED ORDER — ACETAMINOPHEN 500 MG PO TABS
1000.0000 mg | ORAL_TABLET | ORAL | Status: AC
  Administered 2024-02-17: 1000 mg via ORAL
  Filled 2024-02-17: qty 2

## 2024-02-17 MED ORDER — PHENYLEPHRINE HCL-NACL 20-0.9 MG/250ML-% IV SOLN
INTRAVENOUS | Status: DC | PRN
  Administered 2024-02-17: 20 ug/min via INTRAVENOUS

## 2024-02-17 MED ORDER — DEXAMETHASONE SODIUM PHOSPHATE 10 MG/ML IJ SOLN
4.0000 mg | INTRAMUSCULAR | Status: AC
  Administered 2024-02-17: 10 mg via INTRAVENOUS

## 2024-02-17 MED ORDER — HEPARIN SODIUM (PORCINE) 5000 UNIT/ML IJ SOLN
5000.0000 [IU] | INTRAMUSCULAR | Status: AC
  Administered 2024-02-17: 5000 [IU] via SUBCUTANEOUS
  Filled 2024-02-17: qty 1

## 2024-02-17 MED ORDER — LEVONORGESTREL 20 MCG/DAY IU IUD
1.0000 | INTRAUTERINE_SYSTEM | INTRAUTERINE | Status: DC
  Filled 2024-02-17: qty 1

## 2024-02-17 MED ORDER — ACETAMINOPHEN 325 MG PO TABS
650.0000 mg | ORAL_TABLET | ORAL | Status: DC | PRN
  Administered 2024-02-17: 650 mg via ORAL
  Filled 2024-02-17 (×2): qty 2

## 2024-02-17 MED ORDER — LABETALOL HCL 5 MG/ML IV SOLN
INTRAVENOUS | Status: AC
  Filled 2024-02-17: qty 4

## 2024-02-17 MED ORDER — METRONIDAZOLE 500 MG/100ML IV SOLN
500.0000 mg | INTRAVENOUS | Status: AC
  Administered 2024-02-17: 500 mg via INTRAVENOUS
  Filled 2024-02-17: qty 100

## 2024-02-17 MED ORDER — ACETAMINOPHEN 500 MG PO TABS: 1000.0000 mg | ORAL_TABLET | Freq: Once | ORAL | Status: DC

## 2024-02-17 MED ORDER — PROPOFOL 10 MG/ML IV BOLUS
INTRAVENOUS | Status: DC | PRN
  Administered 2024-02-17: 200 mg via INTRAVENOUS
  Administered 2024-02-17: 50 mg via INTRAVENOUS

## 2024-02-17 MED ORDER — ENOXAPARIN SODIUM 40 MG/0.4ML IJ SOSY
40.0000 mg | PREFILLED_SYRINGE | INTRAMUSCULAR | Status: DC
  Administered 2024-02-18: 40 mg via SUBCUTANEOUS
  Filled 2024-02-17: qty 0.4

## 2024-02-17 MED ORDER — PHENYLEPHRINE 80 MCG/ML (10ML) SYRINGE FOR IV PUSH (FOR BLOOD PRESSURE SUPPORT)
PREFILLED_SYRINGE | INTRAVENOUS | Status: AC
  Filled 2024-02-17: qty 10

## 2024-02-17 MED ORDER — SCOPOLAMINE 1 MG/3DAYS TD PT72
1.0000 | MEDICATED_PATCH | TRANSDERMAL | Status: DC
  Administered 2024-02-17: 1.5 mg via TRANSDERMAL
  Filled 2024-02-17: qty 1

## 2024-02-17 MED ORDER — DEXAMETHASONE SODIUM PHOSPHATE 10 MG/ML IJ SOLN
INTRAMUSCULAR | Status: AC
  Filled 2024-02-17: qty 1

## 2024-02-17 MED ORDER — FENTANYL CITRATE (PF) 250 MCG/5ML IJ SOLN
INTRAMUSCULAR | Status: DC | PRN
  Administered 2024-02-17 (×3): 50 ug via INTRAVENOUS
  Administered 2024-02-17: 100 ug via INTRAVENOUS

## 2024-02-17 MED ORDER — LACTATED RINGERS IV SOLN: INTRAVENOUS | Status: DC

## 2024-02-17 MED ORDER — LIDOCAINE 2% (20 MG/ML) 5 ML SYRINGE
INTRAMUSCULAR | Status: DC | PRN
  Administered 2024-02-17: 100 mg via INTRAVENOUS

## 2024-02-17 MED ORDER — CEFAZOLIN SODIUM-DEXTROSE 3-4 GM/150ML-% IV SOLN
3.0000 g | INTRAVENOUS | Status: AC
  Administered 2024-02-17: 3 g via INTRAVENOUS
  Filled 2024-02-17: qty 150

## 2024-02-17 MED ORDER — SODIUM CHLORIDE 0.9 % IR SOLN
Status: DC | PRN
Start: 2024-02-17 — End: 2024-02-17
  Administered 2024-02-17: 1000 mL

## 2024-02-17 MED ORDER — DROPERIDOL 2.5 MG/ML IJ SOLN
0.6250 mg | Freq: Once | INTRAMUSCULAR | Status: DC | PRN
Start: 2024-02-17 — End: 2024-02-17

## 2024-02-17 MED ORDER — ORAL CARE MOUTH RINSE: 15.0000 mL | Freq: Once | OROMUCOSAL | Status: AC

## 2024-02-17 MED ORDER — SUGAMMADEX SODIUM 200 MG/2ML IV SOLN
INTRAVENOUS | Status: DC | PRN
  Administered 2024-02-17: 400 mg via INTRAVENOUS

## 2024-02-17 MED ORDER — LIDOCAINE HCL (PF) 2 % IJ SOLN
INTRAMUSCULAR | Status: AC
  Filled 2024-02-17: qty 5

## 2024-02-17 MED ORDER — KCL IN DEXTROSE-NACL 10-5-0.45 MEQ/L-%-% IV SOLN
INTRAVENOUS | Status: DC
  Filled 2024-02-17: qty 1000

## 2024-02-17 MED ORDER — MIDAZOLAM HCL 2 MG/2ML IJ SOLN
INTRAMUSCULAR | Status: DC | PRN
  Administered 2024-02-17: 2 mg via INTRAVENOUS

## 2024-02-17 MED ORDER — BUPIVACAINE LIPOSOME 1.3 % IJ SUSP
INTRAMUSCULAR | Status: AC
  Filled 2024-02-17: qty 20

## 2024-02-17 MED ORDER — BUPIVACAINE HCL 0.25 % IJ SOLN
INTRAMUSCULAR | Status: DC | PRN
  Administered 2024-02-17: 30 mL

## 2024-02-17 SURGICAL SUPPLY — 81 items
APPLICATOR ARISTA FLEXITIP XL (MISCELLANEOUS) IMPLANT
APPLICATOR SURGIFLO ENDO (HEMOSTASIS) IMPLANT
BAG COUNTER SPONGE SURGICOUNT (BAG) ×2 IMPLANT
BAG LAPAROSCOPIC 12 15 PORT 16 (BASKET) IMPLANT
BLADE SURG SZ10 CARB STEEL (BLADE) IMPLANT
COVER BACK TABLE 60X90IN (DRAPES) ×2 IMPLANT
COVER TIP SHEARS 8 DVNC (MISCELLANEOUS) ×2 IMPLANT
DERMABOND ADVANCED .7 DNX12 (GAUZE/BANDAGES/DRESSINGS) ×2 IMPLANT
DEVICE MYOSURE LITE (MISCELLANEOUS) IMPLANT
DEVICE MYOSURE REACH (MISCELLANEOUS) IMPLANT
DILATOR CANAL MILEX (MISCELLANEOUS) IMPLANT
DRAPE ARM DVNC X/XI (DISPOSABLE) ×8 IMPLANT
DRAPE COLUMN DVNC XI (DISPOSABLE) ×2 IMPLANT
DRAPE SHEET LG 3/4 BI-LAMINATE (DRAPES) ×2 IMPLANT
DRAPE SURG IRRIG POUCH 19X23 (DRAPES) ×2 IMPLANT
DRIVER NDL MEGA SUTCUT DVNCXI (INSTRUMENTS) ×2 IMPLANT
DRIVER NDLE MEGA SUTCUT DVNCXI (INSTRUMENTS) ×2 IMPLANT
DRSG OPSITE POSTOP 4X6 (GAUZE/BANDAGES/DRESSINGS) IMPLANT
DRSG OPSITE POSTOP 4X8 (GAUZE/BANDAGES/DRESSINGS) IMPLANT
ELECT PENCIL ROCKER SW 15FT (MISCELLANEOUS) IMPLANT
ELECT REM PT RETURN 15FT ADLT (MISCELLANEOUS) ×2 IMPLANT
FORCEPS BPLR FENES DVNC XI (FORCEP) ×2 IMPLANT
FORCEPS PROGRASP DVNC XI (FORCEP) ×2 IMPLANT
GAUZE 4X4 16PLY ~~LOC~~+RFID DBL (SPONGE) ×4 IMPLANT
GLOVE BIO SURGEON STRL SZ 6 (GLOVE) ×8 IMPLANT
GLOVE BIO SURGEON STRL SZ 6.5 (GLOVE) ×2 IMPLANT
GOWN STRL REUS W/ TWL LRG LVL3 (GOWN DISPOSABLE) ×8 IMPLANT
GRASPER SUT TROCAR 14GX15 (MISCELLANEOUS) IMPLANT
HEMOSTAT ARISTA ABSORB 3G PWDR (HEMOSTASIS) IMPLANT
HOLDER FOLEY CATH W/STRAP (MISCELLANEOUS) IMPLANT
IRRIGATION SUCT STRKRFLW 2 WTP (MISCELLANEOUS) ×2 IMPLANT
IV NS IRRIG 3000ML ARTHROMATIC (IV SOLUTION) ×2 IMPLANT
KIT PROCEDURE DVNC SI (MISCELLANEOUS) IMPLANT
KIT PROCEDURE FLUENT (KITS) IMPLANT
KIT TURNOVER KIT A (KITS) IMPLANT
LIGASURE IMPACT 36 18CM CVD LR (INSTRUMENTS) IMPLANT
LOOP CUTTING BIPOLAR 21FR (ELECTRODE) IMPLANT
MANIPULATOR ADVINCU DEL 3.0 PL (MISCELLANEOUS) IMPLANT
MANIPULATOR ADVINCU DEL 3.5 PL (MISCELLANEOUS) IMPLANT
MANIPULATOR UTERINE 4.5 ZUMI (MISCELLANEOUS) IMPLANT
NDL HYPO 21X1.5 SAFETY (NEEDLE) ×2 IMPLANT
NDL SPNL 18GX3.5 QUINCKE PK (NEEDLE) IMPLANT
NEEDLE HYPO 21X1.5 SAFETY (NEEDLE) ×2 IMPLANT
NEEDLE SPNL 18GX3.5 QUINCKE PK (NEEDLE) IMPLANT
OBTURATOR OPTICALSTD 8 DVNC (TROCAR) ×2 IMPLANT
PACK ROBOT GYN CUSTOM WL (TRAY / TRAY PROCEDURE) ×2 IMPLANT
PACK VAGINAL WOMENS (CUSTOM PROCEDURE TRAY) ×2 IMPLANT
PAD OB MATERNITY 11 LF (PERSONAL CARE ITEMS) IMPLANT
PAD POSITIONING PINK XL (MISCELLANEOUS) ×2 IMPLANT
PORT ACCESS TROCAR AIRSEAL 12 (TROCAR) IMPLANT
RETRACTOR LAPSCP 12X46 CVD (ENDOMECHANICALS) IMPLANT
SCISSORS MNPLR CVD DVNC XI (INSTRUMENTS) ×2 IMPLANT
SCRUB CHG 4% DYNA-HEX 4OZ (MISCELLANEOUS) IMPLANT
SEAL ROD LENS SCOPE MYOSURE (ABLATOR) IMPLANT
SEAL UNIV 5-12 XI (MISCELLANEOUS) ×8 IMPLANT
SET TRI-LUMEN FLTR TB AIRSEAL (TUBING) ×2 IMPLANT
SPIKE FLUID TRANSFER (MISCELLANEOUS) ×2 IMPLANT
SPONGE T-LAP 18X18 ~~LOC~~+RFID (SPONGE) IMPLANT
SURGIFLO W/THROMBIN 8M KIT (HEMOSTASIS) IMPLANT
SUT MNCRL AB 4-0 PS2 18 (SUTURE) IMPLANT
SUT PDS AB 1 TP1 96 (SUTURE) IMPLANT
SUT STRATA PDS 0 30 CT-2.5 (SUTURE) IMPLANT
SUT VIC AB 0 CT1 27XBRD ANTBC (SUTURE) IMPLANT
SUT VIC AB 2-0 CT1 TAPERPNT 27 (SUTURE) IMPLANT
SUT VIC AB 2-0 SH 27X BRD (SUTURE) IMPLANT
SUT VIC AB 4-0 PS2 18 (SUTURE) ×4 IMPLANT
SUT VICRYL 0 27 CT2 27 ABS (SUTURE) ×2 IMPLANT
SUT VLOC 180 0 9IN GS21 (SUTURE) IMPLANT
SYR 10ML LL (SYRINGE) IMPLANT
SYR BULB IRRIG 60ML STRL (SYRINGE) ×2 IMPLANT
SYSTEM BAG RETRIEVAL 10MM (BASKET) IMPLANT
SYSTEM TISS REMOVAL MYOSURE XL (MISCELLANEOUS) IMPLANT
SYSTEM WOUND ALEXIS 18CM MED (MISCELLANEOUS) IMPLANT
TOWEL OR 17X26 10 PK STRL BLUE (TOWEL DISPOSABLE) ×2 IMPLANT
TRAP SPECIMEN MUCUS 40CC (MISCELLANEOUS) IMPLANT
TRAY FOLEY MTR SLVR 16FR STAT (SET/KITS/TRAYS/PACK) ×2 IMPLANT
TROCAR PORT AIRSEAL 5X120 (TROCAR) IMPLANT
UNDERPAD 30X36 HEAVY ABSORB (UNDERPADS AND DIAPERS) ×4 IMPLANT
WATER STERILE IRR 1000ML POUR (IV SOLUTION) ×2 IMPLANT
WATER STERILE IRR 500ML POUR (IV SOLUTION) ×2 IMPLANT
YANKAUER SUCT BULB TIP 10FT TU (MISCELLANEOUS) IMPLANT

## 2024-02-17 NOTE — Anesthesia Procedure Notes (Addendum)
 Procedure Name: Intubation Date/Time: 02/17/2024 1:26 PM  Performed by: Linard Reno, CRNAPre-anesthesia Checklist: Patient identified, Emergency Drugs available, Suction available and Patient being monitored Patient Re-evaluated:Patient Re-evaluated prior to induction Oxygen Delivery Method: Circle System Utilized Preoxygenation: Pre-oxygenation with 100% oxygen Induction Type: IV induction Ventilation: Mask ventilation without difficulty and Oral airway inserted - appropriate to patient size Laryngoscope Size: Glidescope and 3 Grade View: Grade II Tube type: Oral Tube size: 7.5 mm Number of attempts: 1 Airway Equipment and Method: Stylet, Oral airway and Video-laryngoscopy Placement Confirmation: ETT inserted through vocal cords under direct vision, positive ETCO2 and breath sounds checked- equal and bilateral Secured at: 22 cm Tube secured with: Tape Dental Injury: Teeth and Oropharynx as per pre-operative assessment

## 2024-02-17 NOTE — Anesthesia Postprocedure Evaluation (Signed)
 Anesthesia Post Note  Patient: Tracy Henry  Procedure(s) Performed: HYSTERECTOMY, TOTAL, ROBOT-ASSISTED, LAPAROSCOPIC, WITH BILATERAL SALPINGO-OOPHORECTOMY CYSTOSCOPY     Patient location during evaluation: PACU Anesthesia Type: General Level of consciousness: sedated and patient cooperative Pain management: pain level controlled Vital Signs Assessment: post-procedure vital signs reviewed and stable Respiratory status: spontaneous breathing Cardiovascular status: stable Anesthetic complications: no Comments: Pt requiring O2 to keep SpO2 > 90%. Has dropped to 70's SpO2 in PACU. Dr. Orvil Bland notified that patient will need overnight observation.   No notable events documented.  Last Vitals:  Vitals:   02/17/24 1900 02/17/24 1904  BP: (!) 175/117 (!) 164/95  Pulse: 70 67  Resp: 14 10  Temp:    SpO2: 99% 100%    Last Pain:  Vitals:   02/17/24 1830  TempSrc:   PainSc: 3                  Gorman Laughter

## 2024-02-17 NOTE — Op Note (Signed)
 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 Obesity made retroperitoneal visualization limited and increased the complexity of the case and necessitated additional instrumentation for retraction. Obesity related complexity increased the duration of the procedure by 60 minutes.   Surgeon: Wiley Hanger MD  Assistant Surgeon: Vira Grieves, NP (an NP assistant was necessary for tissue manipulation, management of robotic instrumentation, retraction and positioning due to the complexity of the case and hospital policies).   Anesthesia: GET  Urine Output: 250 cc  Operative Findings: ON EUA, mobile uterus. ON intra-abdominal entry, normal upper abdominal survey. Large omentum, normal small and large bowel. Redundant colon. Significant adiposity of the abdomen and pelvis causing significant difficulty with visualization. Large 14 cm cystic and smooth mass arising from the left ovary - drained with minimal intra-abdominal spillage given inability to safely see the pelvic sidewall to remove the adnexa intact. Clear fluid on drainage of just over 1 L. Normal appearing right tube and ovary. Uterus 8cm and normal in appearance. On cystoscopy, bladder dome intact, good efflux from bilateral ureteral orifices.  Estimated Blood Loss:  125 cc      Total IV Fluids: see I&O flowsheet         Specimens: uterus, cervix, bilateral tubes and ovaries         Complications:  None apparent; patient tolerated the procedure well.         Disposition: PACU - hemodynamically stable.  Procedure Details  The patient was seen in the Holding Room. The risks, benefits, complications, treatment options, and  expected outcomes were discussed with the patient.  The patient concurred with the proposed plan, giving informed consent.  The site of surgery properly noted/marked. The patient was identified as Tracy Henry and the procedure verified as a Robotic-assisted hysterectomy with bilateral salpingo oophorectomy.   After induction of anesthesia, the patient was draped and prepped in the usual sterile manner. Patient was placed in supine position after anesthesia and draped and prepped in the usual sterile manner as follows: Her arms were tucked to her side with all appropriate precautions.  The patient was secured to the bed using padding and tape across her chest.  The patient was placed in the semi-lithotomy position in Canyon stirrups.  The perineum and vagina were prepped with CHG. The patient's abdomen was prepped with ChloraPrep and then she was draped after the prep had been allowed to dry for 3 minutes.  A Time Out was held and the above information confirmed. Tilt test was performed.  The urethra was prepped with Betadine. Foley catheter was placed.  A sterile speculum was placed in the vagina.  The cervix was grasped with a single-tooth tenaculum. The cervix was dilated with Adriana Hopping dilators.  The Advincula uterine manipulator with a 3.5 colpotomizer ring was placed without difficulty.  A pneum occluder balloon was placed over the manipulator.  OG tube placement was confirmed and to suction.   Next, a 5 mm skin incision was made 1 cm below the subcostal margin in the midclavicular line.  The 5 mm Optiview port and scope was used for direct entry.  Opening pressure was under 10 mm CO2.  The abdomen was insufflated and the findings were noted as above.   At this point and all points during the procedure, the patient's  intra-abdominal pressure did not exceed 15 mmHg. Next, an 8 mm skin incision was made the umbilicus and a right and left port were placed about 8 cm lateral to the robot port on the right and left  side.  A fourth arm was placed on the left.  A 12 mm airseal was placed between the supraumbilical and left mid-abdominal ports. All ports were placed under direct visualization.  The patient was placed in steep Trendelenburg.  The robot was docked in the normal manner.  Given size of the large mass emanating from the left ovary and significant adiposity limiting visualization, decision was made to proceed with careful drainage of the cyst.  Monopolar electrocautery was used to incise the cyst near the anterior portion and create a small opening into which the suction irrigator was placed.  There was very minimal spillage of fluid.  Over a liter of clear fluid was drained.  Once the cyst was decompressed, attention was turned to the left.  The left peritoneum was opened parallel to the IP ligament to open up the retroperitoneal space.  Given deep pelvis and poor visualization, the ureter was not visualized along the medial leaf of the broad ligament however the infundibulopelvic ligament was carefully skeletonized, cauterized, and transected.  The fallopian tube and utero-ovarian ligaments were then isolated, cauterized, and transected.  Once peritoneal attachments of the adnexa were freed, the adnexa was placed in a 15 mm Endo Catch bag.  Discussion with anesthesia at this time, she was tolerating 25 degrees of Trendelenburg without significant issues.  Decision was made to proceed with total hysterectomy and right salpingo-oophorectomy despite difficulty with visualization.  Bowel paddled was used for much of the surgery by my assistant to help manipulate the small intestine and large bowel to keep them out of the immediate surgical field.  The right peritoneum was opened parallel to the IP ligament to open the retroperitoneal spaces. The round ligaments were transected. Given deep pelvis and poor visualization, the ureter was not visualized along the medial leaf of the broad ligament however the  infundibulopelvic ligament was carefully skeletonized, cauterized, and transected.    The posterior peritoneum was taken down to the level of the KOH ring.  The anterior peritoneum was also taken down.  The bladder flap was created to the level of the KOH ring.  The uterine artery on the right side was skeletonized, cauterized and cut in the normal manner.  A similar procedure was performed on the left.  The colpotomy was made and the uterus, cervix, right tube and ovary were amputated and delivered through the vagina.  Pedicles were inspected and excellent hemostasis was achieved.  The left tube and ovary was delivered in an Endo Catch bag through the vagina.  The colpotomy at the vaginal cuff was closed with 0 Vicryl using a figure of eight at each apex and 0 V-Loc to close the midportion of the cuff in two layers in running fashion.  Irrigation was used and excellent hemostasis was achieved.  Juliaette Ober was placed over the surgical bed. Cystoscopy was performed after the bladder was backfilled with 200cc of sterile fluid. Findings noted above.  At this point in the procedure was completed.  Robotic instruments were removed under direct visulaization.  The robot was undocked. The fascia at the 12 mm port was closed with 0 Vicryl using a PMI fascial closure device.  The subcuticular tissue was closed with 4-0 Vicryl and the skin was closed with 4-0 Monocryl in a  subcuticular manner.  Dermabond was applied.    The vagina was swabbed with  minimal bleeding noted. Foley catheter was removed.  All sponge, lap and needle counts were correct x  3.   The patient was transferred to the recovery room in stable condition.  Wiley Hanger, MD

## 2024-02-17 NOTE — Discharge Instructions (Addendum)
 AFTER SURGERY INSTRUCTIONS   Return to work: 4-6 weeks if applicable  Today Dr. Orvil Bland removed the uterus, cervix, both tubes, and both ovaries.  Continue using incentive spirometer frequently at home to help keep your lungs clear.   Activity: 1. Be up and out of the bed during the day.  Take a nap if needed.  You may walk up steps but be careful and use the hand rail.  Stair climbing will tire you more than you think, you may need to stop part way and rest.    2. No lifting or straining for 6 weeks over 10 pounds. No pushing, pulling, straining for 6 weeks.   3. No driving for 1-61 days when the following criteria have been met: Do not drive if you are taking narcotic pain medicine and make sure that your reaction time has returned.    4. You can shower as soon as the next day after surgery. Shower daily.  Use your regular soap and water (not directly on the incision) and pat your incision(s) dry afterwards; don't rub.  No tub baths or submerging your body in water until cleared by your surgeon. If you have the soap that was given to you by pre-surgical testing that was used before surgery, you do not need to use it afterwards because this can irritate your incisions.    5. No sexual activity and nothing in the vagina for 12 weeks.   6. You may experience a small amount of clear drainage from your incisions, which is normal.  If the drainage persists, increases, or changes color please call the office.   7. Do not use creams, lotions, or ointments such as neosporin on your incisions after surgery until advised by your surgeon because they can cause removal of the dermabond glue on your incisions.     8. You may experience vaginal spotting after surgery or when the stitches at the top of the vagina begin to dissolve.  The spotting is normal but if you experience heavy bleeding, call our office.   9. Take Tylenol  or ibuprofen  first for pain if you are able to take these medications and only  use narcotic pain medication for severe pain not relieved by the Tylenol  or Ibuprofen .  Monitor your Tylenol  intake to a max of 4,000 mg in a 24 hour period. You can alternate these medications after surgery.   Diet: 1. Low sodium Heart Healthy Diet is recommended but you are cleared to resume your normal (before surgery) diet after your procedure.   2. It is safe to use a laxative, such as Miralax or Colace, if you have difficulty moving your bowels before surgery. You have been prescribed Sennakot-S to take at bedtime every evening after surgery to keep bowel movements regular and to prevent constipation.     Wound Care: 1. Keep clean and dry.  Shower daily.   Reasons to call the Doctor: Fever - Oral temperature greater than 100.4 degrees Fahrenheit Foul-smelling vaginal discharge Difficulty urinating Nausea and vomiting Increased pain at the site of the incision that is unrelieved with pain medicine. Difficulty breathing with or without chest pain New calf pain especially if only on one side Sudden, continuing increased vaginal bleeding with or without clots.   Contacts: For questions or concerns you should contact:   Dr. Wiley Hanger at [phone]   Tracy Grieves, NP at [phone]   After Hours: call [phone] and have the GYN Oncologist paged/contacted (after 5 pm or on the weekends).  You will speak with an after hours RN and let he or she know you have had surgery.   Messages sent via mychart are for non-urgent matters and are not responded to after hours so for urgent needs, please call the after hours number.

## 2024-02-17 NOTE — Progress Notes (Signed)
 Pt is supposed to go home, when pt is fell a sleep O2 sat dropped to 76% with room air, sitting in the chair, alert oriented, when se is alert o2 sat is 96% with deep breathe. Applied O2 2l/min. Notified Dr. Joann Mu.

## 2024-02-17 NOTE — Interval H&P Note (Signed)
 History and Physical Interval Note:  02/17/2024 12:32 PM  Tracy Henry  has presented today for surgery, with the diagnosis of pelvic mass, EIN.  The various methods of treatment have been discussed with the patient and family. After consideration of risks, benefits and other options for treatment, the patient has consented to  Procedure(s): HYSTERECTOMY, TOTAL, ROBOT-ASSISTED, LAPAROSCOPIC, WITH BILATERAL SALPINGO-OOPHORECTOMY (N/A) INJECTION, FOR SENTINEL LYMPH NODE IDENTIFICATION (N/A) LYMPH NODE BIOPSY (N/A) LYMPHADENECTOMY, PELVIS (N/A) LAPAROTOMY (N/A) DILATATION AND CURETTAGE /HYSTEROSCOPY (N/A) INSERTION, INTRAUTERINE DEVICE (N/A) as a surgical intervention.  The patient's history has been reviewed, patient examined, no change in status, stable for surgery.  I have reviewed the patient's chart and labs.  Questions were answered to the patient's satisfaction.     [name]

## 2024-02-17 NOTE — Transfer of Care (Signed)
 Immediate Anesthesia Transfer of Care Note  Patient: Tracy Henry  Procedure(s) Performed: HYSTERECTOMY, TOTAL, ROBOT-ASSISTED, LAPAROSCOPIC, WITH BILATERAL SALPINGO-OOPHORECTOMY CYSTOSCOPY  Patient Location: PACU  Anesthesia Type:General  Level of Consciousness: drowsy  Airway & Oxygen Therapy: Patient Spontanous Breathing and Patient connected to face mask oxygen  Post-op Assessment: Report given to RN and Post -op Vital signs reviewed and stable  Post vital signs: Reviewed and stable  Last Vitals:  Vitals Value Taken Time  BP 128/70 02/17/24 1630  Temp 36.2 C 02/17/24 1620  Pulse 76 02/17/24 1634  Resp 30 02/17/24 1634  SpO2 95 % 02/17/24 1634  Vitals shown include unfiled device data.  Last Pain:  Vitals:   02/17/24 1620  TempSrc:   PainSc: 0-No pain         Complications: No notable events documented.

## 2024-02-18 ENCOUNTER — Encounter: Payer: Self-pay | Admitting: Obstetrics and Gynecology

## 2024-02-18 ENCOUNTER — Encounter (HOSPITAL_COMMUNITY): Payer: Self-pay | Admitting: Gynecologic Oncology

## 2024-02-18 ENCOUNTER — Observation Stay (HOSPITAL_COMMUNITY)

## 2024-02-18 DIAGNOSIS — N8502 Endometrial intraepithelial neoplasia [EIN]: Secondary | ICD-10-CM | POA: Diagnosis not present

## 2024-02-18 LAB — CBC
HCT: 44.2 % (ref 36.0–46.0)
HCT: 49 % — ABNORMAL HIGH (ref 36.0–46.0)
Hemoglobin: 13.6 g/dL (ref 12.0–15.0)
Hemoglobin: 14.3 g/dL (ref 12.0–15.0)
MCH: 26.5 pg (ref 26.0–34.0)
MCH: 26.6 pg (ref 26.0–34.0)
MCHC: 30.8 g/dL (ref 30.0–36.0)
MCHC: 29.2 g/dL — ABNORMAL LOW (ref 30.0–36.0)
MCV: 86 fL (ref 80.0–100.0)
MCV: 91.2 fL (ref 80.0–100.0)
Platelets: 365 10*3/uL (ref 150–400)
Platelets: 305 10*3/uL (ref 150–400)
RBC: 5.14 MIL/uL — ABNORMAL HIGH (ref 3.87–5.11)
RBC: 5.37 MIL/uL — ABNORMAL HIGH (ref 3.87–5.11)
RDW: 14.6 % (ref 11.5–15.5)
RDW: 15 % (ref 11.5–15.5)
WBC: 15.4 10*3/uL — ABNORMAL HIGH (ref 4.0–10.5)
WBC: 17.1 10*3/uL — ABNORMAL HIGH (ref 4.0–10.5)
nRBC: 0 % (ref 0.0–0.2)
nRBC: 0 % (ref 0.0–0.2)

## 2024-02-18 LAB — BASIC METABOLIC PANEL WITH GFR
Anion gap: 11 (ref 5–15)
BUN: 7 mg/dL (ref 6–20)
CO2: 21 mmol/L — ABNORMAL LOW (ref 22–32)
Calcium: 9 mg/dL (ref 8.9–10.3)
Chloride: 104 mmol/L (ref 98–111)
Creatinine, Ser: 0.57 mg/dL (ref 0.44–1.00)
GFR, Estimated: >60 mL/min (ref >60)
Glucose, Bld: 160 mg/dL — ABNORMAL HIGH (ref 70–99)
Potassium: 4.3 mmol/L (ref 3.5–5.1)
Sodium: 136 mmol/L (ref 135–145)

## 2024-02-18 LAB — GLUCOSE, CAPILLARY: Glucose-Capillary: 149 mg/dL — ABNORMAL HIGH (ref 70–99)

## 2024-02-18 MED ORDER — TRAMADOL HCL 50 MG PO TABS
50.0000 mg | ORAL_TABLET | Freq: Four times a day (QID) | ORAL | Status: DC | PRN
  Administered 2024-02-18: 50 mg via ORAL
  Filled 2024-02-18: qty 1

## 2024-02-18 MED ORDER — IOHEXOL 350 MG/ML SOLN
75.0000 mL | Freq: Once | INTRAVENOUS | Status: AC | PRN
  Administered 2024-02-18: 75 mL via INTRAVENOUS

## 2024-02-18 NOTE — Plan of Care (Signed)

## 2024-02-18 NOTE — Progress Notes (Signed)
   02/18/24 0954  TOC Brief Assessment  Insurance and Status Reviewed  Home environment has been reviewed resides in private residence  Prior level of function: Independent  Prior/Current Home Services No current home services  Social Drivers of Health Review SDOH reviewed no interventions necessary  Readmission risk has been reviewed Yes  Transition of care needs no transition of care needs at this time

## 2024-02-18 NOTE — Progress Notes (Signed)
 Patient given discharge paperwork, IV removed, and escorted out via wheelchair.

## 2024-02-18 NOTE — Progress Notes (Signed)
 GYN Oncology Progress Note  Patient is alert, oriented, in no acute distress, resting in bed. No chest pain, dyspnea. Feels well. EKG with HR at 87-76 with sinus rhythm with marked sinus arrhythmia otherwise normal. On preop EKG, interpretation reads normal sinus rhythm with sinus arrhythmia. Cannot rule out Anterior infarct , age undetermined.  CBC reviewed. On auscultation, patient is no longer tachycardic and in regular rate and rhythm. O2 sat on RA was 95-97%. Her pulse was 82-86 bpm. All questions answered from the patient's husband. He states she had an elevation in HR when getting to the chair. The patient will plan on getting to the chair in a few minutes after resting to see what her HR is. Advised Dr. Orvil Bland would be updated on the results, vitals and she would be contacted.   Update: Patient contacted with recommendation to get to the chair and see how her heart rate does. Verbalizing understanding.

## 2024-02-18 NOTE — Progress Notes (Signed)
 1 Day Post-Op Procedure(s) (LRB): HYSTERECTOMY, TOTAL, ROBOT-ASSISTED, LAPAROSCOPIC, WITH BILATERAL SALPINGO-OOPHORECTOMY (N/A) CYSTOSCOPY  Subjective: Patient reports doing well this am. No pain reported. No nausea, emesis, or flatus. Has been tolerating sips of liquids. Has not had solid food. Denies dyspnea, chest pain. Has been using IS. Husband at the bedside. No concerns voiced.   Objective: Vital signs in last 24 hours: Temp:  [97.1 F (36.2 C)-98.7 F (37.1 C)] 98 F (36.7 C) (06/06 0531) Pulse Rate:  [65-106] 106 (06/06 0531) Resp:  [5-22] 18 (06/06 0531) BP: (125-176)/(70-117) 132/76 (06/06 0531) SpO2:  [76 %-100 %] 96 % (06/06 0813) Weight:  [299 lb (135.6 kg)] 299 lb (135.6 kg) (06/05 1208)    Intake/Output from previous day: 06/05 0701 - 06/06 0700 In: 1700 [P.O.:600; I.V.:1100] Out: 2775 [Urine:2650; Blood:125]  Physical Examination (performed by Dr. Orvil Bland): General: alert, cooperative, and no distress Resp: clear to auscultation bilaterally Cardio: mildy tachycardic, regular in rhythm GI: soft, non-tender; bowel sounds normal; no masses,  no organomegaly and incision: lap sites to the abdomen with dermabond are intact with no active drainage Extremities: extremities normal, atraumatic, no cyanosis or edema  Labs: WBC/Hgb/Hct/Plts:  15.4/13.6/44.2/365 (06/06 0415) BUN/Cr/glu/ALT/AST/amyl/lip:  7/0.57/--/--/--/--/-- (06/06 0415)  Assessment: 48 y.o. s/p Procedure(s): HYSTERECTOMY, TOTAL, ROBOT-ASSISTED, LAPAROSCOPIC, WITH BILATERAL SALPINGO-OOPHORECTOMY CYSTOSCOPY: stable. Given drops in saturation while in PACU, the patient was kept overnight on O2. Plan for attempt at weaning this am.  Pain:  Pain is well-controlled on PRN medications.  Heme: Hgb 13.6 and Hct 44.2. Appropriate given preop values and surgical losses.  ID: WBC 15.4. Given decadron  intra-op   CV: Has been mildly tachycardic. Denies heart/chest symptoms. Will continue to monitor with increased  mobility.  GI:  Tolerating po: yes liquids. Planning on eating solid food this am.   GU: Has been voiding without difficulty. Creatinine 0.57 this am.    FEN: No critical values on am labs.  Endo: Hx diabetes in history, no home meds listed.  Prophylaxis: SCDs and lovenox.  Plan: Plan for increased mobility this am. Will monitor O2 sats during this time.  If able to be weaned off O2, tolerating regular diet, meeting post-op milestones, plan for discharge home today.   LOS: 0 days    Suellyn Emory 02/18/2024, 9:06 AM

## 2024-02-18 NOTE — Progress Notes (Signed)
 GYN Oncology Progress Note  Patient getting ready for discharge. RN to recheck pulse since slightly elevated this am. HR was at 103 and went up to 119 when up ambulating. Denies chest pain, dyspnea. Discharge placed on hold with order for CBC stat and EKG given tachycardia. If remains tachycardic and EKG/labs normal, may consider CT angio to rule out PE. Called patient to discuss. No concerns voiced.

## 2024-02-18 NOTE — Progress Notes (Signed)
 GYN Oncology Progress Note  Called patient to check in and discuss Dr. Danniel Duverney recommendations to move forward with the CT chest to rule out PE. Patient is currently resting in the chair per husband's report on the phone. He states she has been tired today and resting. Discussed preop EKG and EKG from today. Advised would recommend outpatient consultation with cardiology for further evaluation. She is asymptomatic. All questions answered. Advised if CT chest is negative, she may be able to be discharged later today with this being based on the timing of scan, results etc.

## 2024-02-18 NOTE — Discharge Summary (Addendum)
 Physician Discharge Summary  Patient ID: Tracy Henry MRN: [id number] DOB/AGE: Mar 18, 1976 48 y.o.  Admit date: 02/17/2024 Discharge date: 02/18/2024  Admission Diagnoses: Pelvic mass  Discharge Diagnoses:  Principal Problem:   Pelvic mass Active Problems:   EIN (endometrial intraepithelial neoplasia)   Post-operative state   Oxygen desaturation   Tachycardia   Discharged Condition:  The patient is in good condition and stable for discharge.    Hospital Course: On 02/17/2024, the patient underwent the following: Procedure(s): HYSTERECTOMY, TOTAL, ROBOT-ASSISTED, LAPAROSCOPIC, WITH BILATERAL SALPINGO-OOPHORECTOMY CYSTOSCOPY. The postoperative course included episodes of desaturations while in PACU, prompting an overnight stay with supplemental oxygen. In the am on POD 1, the patient was doing well, saturation at 96 % on RA, 91-92% on RA with ambulation with no symptoms reported. She did remain tachycardic which prompted CT chest angio to rule out PE which was limited but negative for obvious PE. She was discharged to home in the evening of postoperative day 1 tolerating a regular diet, ambulating, pain controlled, voiding, on RA.  Consults: None  Significant Diagnostic Studies: Labs, CT angio chest to rule out PE  Treatments: Surgery: see above, oxygen supplementation overnight  Discharge Exam (from am exam): Blood pressure (!) 142/88, pulse 94, temperature 98.5 F (36.9 C), temperature source Oral, resp. rate 20, height 5\' 1"  (1.549 m), weight 299 lb (135.6 kg), last menstrual period 11/08/2023, SpO2 97%. General: alert, cooperative, and no distress Resp: clear to auscultation bilaterally Cardio: mildy tachycardic, regular in rhythm GI: soft, non-tender; bowel sounds normal; no masses,  no organomegaly and incision: lap sites to the abdomen with dermabond are intact with no active drainage Extremities: extremities normal, atraumatic, no cyanosis or edema  Disposition: Discharge  disposition: 01-Home or Self Care       Discharge Instructions     Call MD for:  difficulty breathing, headache or visual disturbances   Complete by: As directed    Call MD for:  extreme fatigue   Complete by: As directed    Call MD for:  hives   Complete by: As directed    Call MD for:  persistant dizziness or light-headedness   Complete by: As directed    Call MD for:  persistant nausea and vomiting   Complete by: As directed    Call MD for:  redness, tenderness, or signs of infection (pain, swelling, redness, odor or green/yellow discharge around incision site)   Complete by: As directed    Call MD for:  severe uncontrolled pain   Complete by: As directed    Call MD for:  temperature >100.4   Complete by: As directed    Diet - low sodium heart healthy   Complete by: As directed    Driving Restrictions   Complete by: As directed    No driving for 4-69 days until the following have been met: Do not take narcotics and drive. You need to make sure your reaction time has returned.   Increase activity slowly   Complete by: As directed    Lifting restrictions   Complete by: As directed    No lifting greater than 10 lbs, pushing, pulling, straining for 6 weeks.   Sexual Activity Restrictions   Complete by: As directed    No sexual activity, nothing in the vagina, for 12 weeks.      Allergies as of 02/18/2024   No Known Allergies      Medication List     TAKE these medications    ibuprofen   800 MG tablet Commonly known as: ADVIL  Take 1 tablet (800 mg total) by mouth every 8 (eight) hours as needed for moderate pain (pain score 4-6). For AFTER surgery only   oxyCODONE  5 MG immediate release tablet Commonly known as: Oxy IR/ROXICODONE  Take 1 tablet (5 mg total) by mouth every 4 (four) hours as needed for severe pain (pain score 7-10). For AFTER surgery only, do not take and drive   senna-docusate 1.6-10 MG tablet Commonly known as: Senokot-S Take 2 tablets by mouth  at bedtime. For AFTER surgery, do not take if having diarrhea        Follow-up Information     [name], MD Follow up on 02/24/2024.   Specialty: Gynecologic Oncology Why: in the evening/late afternoon will be a PHONE visit with Dr. Orvil Bland to check in and discuss pathology. IN PERSON visit will be on 03/24/2024 at 8:45 am at the [facility]. Contact information: [address] [place] [address] [phone]                 Greater than thirty minutes were spend for face to face discharge instructions and discharge orders/summary in EPIC.   Signed: Suellyn Emory 02/21/2024, 8:28 AM

## 2024-02-21 ENCOUNTER — Other Ambulatory Visit: Payer: Self-pay | Admitting: Gynecologic Oncology

## 2024-02-21 ENCOUNTER — Telehealth: Payer: Self-pay | Admitting: *Deleted

## 2024-02-21 ENCOUNTER — Ambulatory Visit: Payer: Self-pay | Admitting: Gynecologic Oncology

## 2024-02-21 DIAGNOSIS — R9431 Abnormal electrocardiogram [ECG] [EKG]: Secondary | ICD-10-CM

## 2024-02-21 DIAGNOSIS — R0902 Hypoxemia: Secondary | ICD-10-CM | POA: Insufficient documentation

## 2024-02-21 DIAGNOSIS — R000 Tachycardia, unspecified: Secondary | ICD-10-CM | POA: Insufficient documentation

## 2024-02-21 LAB — SURGICAL PATHOLOGY

## 2024-02-21 NOTE — Telephone Encounter (Signed)
 Spoke with Tracy Henry this morning. She states she is eating, drinking and urinating well. She has had a bowel movement and is passing gas. She is taking senokot as prescribed and encouraged her to drink plenty of water . She denies fever or chills. Incisions are dry and intact. She rates her pain 2/10. Pt is not taking anything for pain. Pt reports no difficulty with shortness of breath, wheezing or cough. Reminded patient that Tracy Grieves, NP has sent in a cardiology consult and that office should be calling you to set up an appointment. Pt verbalized understanding.   Instructed to call office with any fever, chills, purulent drainage, uncontrolled pain or any other questions or concerns. Patient verbalizes understanding.   Pt aware of post op appointments as well as the office number [phone] and after hours number [phone] to call if she has any questions or concerns

## 2024-02-23 ENCOUNTER — Encounter: Payer: Self-pay | Admitting: Gynecologic Oncology

## 2024-02-23 ENCOUNTER — Ambulatory Visit: Admitting: Gynecologic Oncology

## 2024-02-23 DIAGNOSIS — N8502 Endometrial intraepithelial neoplasia [EIN]: Secondary | ICD-10-CM

## 2024-02-23 DIAGNOSIS — R1900 Intra-abdominal and pelvic swelling, mass and lump, unspecified site: Secondary | ICD-10-CM

## 2024-02-23 DIAGNOSIS — Z90710 Acquired absence of both cervix and uterus: Secondary | ICD-10-CM

## 2024-02-23 DIAGNOSIS — Z9079 Acquired absence of other genital organ(s): Secondary | ICD-10-CM

## 2024-02-23 DIAGNOSIS — Z90722 Acquired absence of ovaries, bilateral: Secondary | ICD-10-CM

## 2024-02-23 NOTE — Progress Notes (Signed)
 Gynecologic Oncology Telehealth Note: Gyn-Onc  I connected with Tracy Henry on 02/23/24 at  6:00 PM EDT by telephone and verified that I am speaking with the correct person using two identifiers.  I discussed the limitations, risks, security and privacy concerns of performing an evaluation and management service by telemedicine and the availability of in-person appointments. I also discussed with the patient that there may be a patient responsible charge related to this service. The patient expressed understanding and agreed to proceed.  Other persons participating in the visit and their role in the encounter: none.  Patient's location: home Provider's location: [facility]  Reason for Visit: follow-up  Treatment History: Patient has a long history of recurrent endometrial polyps. In 2011, patient underwent D&C and polypectomy.  Pathology showed benign fragments of endometrium and pseudodecidualization of the stroma consistent with hormone effect.  No hyperplasia or malignancy. In 2016, in the setting of abnormal uterine bleeding, the patient underwent hysteroscopy with D&C, polypectomy.  Findings at the time of her surgery included multiple endometrial polyps.  Pathology revealed proliferative endometrium with simple hyperplasia without atypia, fragments of endometrial polyp.  No atypia or carcinoma noted. In 03/2019, she underwent hysteroscopy, polypectomy, and D&C.  Pathology at that time showed benign endometrial type polyp, secretory endometrium with breakdown.  No hyperplasia or malignancy noted. At the time of this surgery in 2020, outside ultrasound revealed a 2.1 cm simple left ovarian cyst.   More recently, she presented again with abnormal uterine bleeding. Pelvic ultrasound exam at [facility] on/15/25: Uterus measures 8.2 x 4.7 x 5 cm with a thickened endometrium measuring 12.3 mm.  Bilateral ovaries with PCOS appearance.  Large complex cystic mass superior and anterior to the uterus  measuring 11.8 x 10.2 cm with internal layering debris and at least 1 septation. CA125: 6.7. MRI of the pelvis on 01/13/2024: Large, relatively simple fluid signal cyst in the midline pelvis measures 15 x 11.4 x 12.6 cm with a single thin septation.  Appears to arise from the left ovary although both ovaries closely abut the mass.  Ovaries otherwise normal in appearance with multiple small functional follicles bilaterally.  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  No overtly solid mass or suspicious contrast-enhancement.  No adenopathy or evidence of metastatic disease.   Recent endometrial biopsy showed EIN, some changes of suggestive but not diagnostic of well-differentiated endometrial adenocarcinoma.  02/17/24  Interval History: Doing well.   Having some pain with 2 of her incisions, overall improving daily.   Having bowel function, notes stool is soft, not using any medications.   Denies urinary symptoms or vaginal bleeding.   Reports breathing has felt normal, denies any shortness of breath.  Past Medical/Surgical History: Past Medical History:  Diagnosis Date   Cerumen impaction 11/15/2019   Environmental allergies    Hyperlipidemia    diet controlled, no meds   IDA (iron deficiency anemia)    Morbidly obese (HCC)    Obesity, morbid, BMI 50 or higher (HCC)    Type 2 diabetes mellitus (HCC)    followed by pcp   Uterine polyp    Removed in 2010   Wears glasses     Past Surgical History:  Procedure Laterality Date   COLONOSCOPY WITH PROPOFOL  N/A 06/21/2020   Procedure: COLONOSCOPY WITH PROPOFOL ;  Surgeon: Irby Mannan, MD;  Location: [facility];  Service: Endoscopy;  Laterality: N/A;   CYSTOSCOPY  02/17/2024   Procedure: CYSTOSCOPY;  Surgeon: [name], MD;  Location: [facility];  Service: Gynecology;;  DILATATION & CURRETTAGE/HYSTEROSCOPY WITH RESECTOCOPE N/A 04/02/2015   Procedure: DILATATION & CURETTAGE/HYSTEROSCOPY WITH RESECTOCOPE;  Surgeon: Meriam Stamp, MD;   Location: [facility];  Service: Gynecology;  Laterality: N/A;   HYSTEROSCOPY WITH NOVASURE N/A 03/22/2019   Procedure: HYSTEROSCOPY WITH POLYPECTOMY AND DILATATION AND CURRETTAGE;  Surgeon: Yalcinkaya, Tamer, MD;  Location: [facility];  Service: Gynecology;  Laterality: N/A;   HYSTEROSCOPY WITH RESECTOSCOPE  11-11-2009   dr Harless Lien @[facility]    polypectomy's   LAPAROSCOPIC CHOLECYSTECTOMY  09-28-2008   dr b. Hildy Lowers  @[facility]    ROBOTIC ASSISTED TOTAL HYSTERECTOMY WITH BILATERAL SALPINGO OOPHERECTOMY N/A 02/17/2024   Procedure: HYSTERECTOMY, TOTAL, ROBOT-ASSISTED, LAPAROSCOPIC, WITH BILATERAL SALPINGO-OOPHORECTOMY;  Surgeon: [name], MD;  Location: [facility];  Service: Gynecology;  Laterality: N/A;   TONSILLECTOMY AND ADENOIDECTOMY  1984  approx.    Family History  Problem Relation Age of Onset   Hypertension Mother    Liver cancer Mother    Hypertension Father    Breast cancer Sister 63   Breast cancer Paternal Grandmother    Ovarian cancer Paternal Grandmother    Colon cancer Neg Hx    Pancreatic cancer Neg Hx    Prostate cancer Neg Hx    Endometrial cancer Neg Hx     Social History   Socioeconomic History   Marital status: Married    Spouse name: Not on file   Number of children: Not on file   Years of education: Not on file   Highest education level: Some college, no degree  Occupational History   Not on file  Tobacco Use   Smoking status: Never   Smokeless tobacco: Never  Vaping Use   Vaping status: Never Used  Substance and Sexual Activity   Alcohol use: No    Alcohol/week: 0.0 standard drinks of alcohol   Drug use: Never   Sexual activity: Yes    Birth control/protection: None  Other Topics Concern   Not on file  Social History Narrative   Married for 17 years    No children   Work for [employer] for 17 years   Enjoys reading   From [employer]   Social Drivers of [employer] Strain: Low Risk  (05/01/2023)   Overall Financial Resource  Strain (CARDIA)    Difficulty of Paying Living Expenses: Not hard at all  Food Insecurity: No Food Insecurity (02/17/2024)   Hunger Vital Sign    Worried About Running Out of Food in the Last Year: Never true    Ran Out of Food in the Last Year: Never true  Transportation Needs: No Transportation Needs (02/17/2024)   PRAPARE - Administrator, Civil Service (Medical): No    Lack of Transportation (Non-Medical): No  Physical Activity: Insufficiently Active (05/01/2023)   Exercise Vital Sign    Days of Exercise per Week: 1 day    Minutes of Exercise per Session: 10 min  Stress: No Stress Concern Present (05/01/2023)   [employer] of Occupational Health - Occupational Stress Questionnaire    Feeling of Stress : Not at all  Social Connections: Moderately Integrated (05/01/2023)   Social Connection and Isolation Panel [NHANES]    Frequency of Communication with Friends and Family: More than three times a week    Frequency of Social Gatherings with Friends and Family: Once a week    Attends Religious Services: More than 4 times per year    Active Member of [employer] or Organizations: No    Attends [employer]  or Organization Meetings: Not on file    Marital Status: Married    Current Medications:  Current Outpatient Medications:    ibuprofen  (ADVIL ) 800 MG tablet, Take 1 tablet (800 mg total) by mouth every 8 (eight) hours as needed for moderate pain (pain score 4-6). For AFTER surgery only, Disp: 30 tablet, Rfl: 0   oxyCODONE  (OXY IR/ROXICODONE ) 5 MG immediate release tablet, Take 1 tablet (5 mg total) by mouth every 4 (four) hours as needed for severe pain (pain score 7-10). For AFTER surgery only, do not take and drive, Disp: 15 tablet, Rfl: 0   senna-docusate (SENOKOT-S) 8.6-50 MG tablet, Take 2 tablets by mouth at bedtime. For AFTER surgery, do not take if having diarrhea, Disp: 30 tablet, Rfl: 0  Review of Symptoms: Pertinent positives as per HPI.  Physical Exam: Deferred given  limitations of phone visit.  Laboratory & Radiologic Studies: A. 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   Assessment & Plan: Tracy Henry is a 48 y.o. 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  Patient is overall doing very well, meeting postoperative milestones.  Discussed continued expectations and restrictions.  Reviewed pathology with her.  She is very happy with this news.  All questions answered.  I discussed the assessment and treatment plan with the patient. The patient was provided with an opportunity to ask questions and all were answered. The patient agreed with the plan and demonstrated an understanding of the instructions.   The patient was advised to call back or see an in-person evaluation if the symptoms worsen or if the condition fails to improve as anticipated.   8 minutes of total time was spent for this patient encounter, including preparation, phone counseling with the patient and coordination of care, and documentation of the encounter.   Wiley Hanger, MD  Division of Gynecologic Oncology  Department of Obstetrics and Gynecology  [facility]

## 2024-02-24 ENCOUNTER — Inpatient Hospital Stay: Attending: Gynecologic Oncology | Admitting: Gynecologic Oncology

## 2024-03-24 ENCOUNTER — Inpatient Hospital Stay: Attending: Gynecologic Oncology | Admitting: Gynecologic Oncology

## 2024-03-24 ENCOUNTER — Encounter: Payer: Self-pay | Admitting: Gynecologic Oncology

## 2024-03-24 VITALS — BP 131/67 | HR 79 | Temp 97.9°F | Resp 20 | Wt 298.8 lb

## 2024-03-24 DIAGNOSIS — R1900 Intra-abdominal and pelvic swelling, mass and lump, unspecified site: Secondary | ICD-10-CM

## 2024-03-24 DIAGNOSIS — N8502 Endometrial intraepithelial neoplasia [EIN]: Secondary | ICD-10-CM

## 2024-03-24 NOTE — Progress Notes (Signed)
 Gynecologic Oncology Return Clinic Visit  03/24/24  Reason for Visit: follow-up  Treatment History: Patient has a long history of recurrent endometrial polyps. In 2011, patient underwent D&C and polypectomy.  Pathology showed benign fragments of endometrium and pseudodecidualization of the stroma consistent with hormone effect.  No hyperplasia or malignancy. In 2016, in the setting of abnormal uterine bleeding, the patient underwent hysteroscopy with D&C, polypectomy.  Findings at the time of her surgery included multiple endometrial polyps.  Pathology revealed proliferative endometrium with simple hyperplasia without atypia, fragments of endometrial polyp.  No atypia or carcinoma noted. In 03/2019, she underwent hysteroscopy, polypectomy, and D&C.  Pathology at that time showed benign endometrial type polyp, secretory endometrium with breakdown.  No hyperplasia or malignancy noted. At the time of this surgery in 2020, outside ultrasound revealed a 2.1 cm simple left ovarian cyst.   More recently, she presented again with abnormal uterine bleeding. Pelvic ultrasound exam at [facility] on/15/25: Uterus measures 8.2 x 4.7 x 5 cm with a thickened endometrium measuring 12.3 mm.  Bilateral ovaries with PCOS appearance.  Large complex cystic mass superior and anterior to the uterus measuring 11.8 x 10.2 cm with internal layering debris and at least 1 septation. CA125: 6.7. MRI of the pelvis on 01/13/2024: Large, relatively simple fluid signal cyst in the midline pelvis measures 15 x 11.4 x 12.6 cm with a single thin septation.  Appears to arise from the left ovary although both ovaries closely abut the mass.  Ovaries otherwise normal in appearance with multiple small functional follicles bilaterally.  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  No overtly solid mass or suspicious contrast-enhancement.  No adenopathy or evidence of metastatic disease.   Recent endometrial biopsy showed EIN, some changes of  suggestive but not diagnostic of well-differentiated endometrial adenocarcinoma.   02/17/24: Robotic-assisted laparoscopic total hysterectomy with bilateral salpingo-oophorectomy   Interval History: Doing well.  Has occasional abdominal twinges, denies any significant abdominal pain.  Reports normal bowel bladder function.  Denies any vaginal bleeding.  Past Medical/Surgical History: Past Medical History:  Diagnosis Date   Cerumen impaction 11/15/2019   Environmental allergies    Hyperlipidemia    diet controlled, no meds   IDA (iron deficiency anemia)    Morbidly obese (HCC)    Obesity, morbid, BMI 50 or higher (HCC)    Type 2 diabetes mellitus (HCC)    followed by pcp   Uterine polyp    Removed in 2010   Wears glasses     Past Surgical History:  Procedure Laterality Date   COLONOSCOPY WITH PROPOFOL  N/A 06/21/2020   Procedure: COLONOSCOPY WITH PROPOFOL ;  Surgeon: Janalyn Keene NOVAK, MD;  Location: [facility];  Service: Endoscopy;  Laterality: N/A;   CYSTOSCOPY  02/17/2024   Procedure: CYSTOSCOPY;  Surgeon: Viktoria Comer SAUNDERS, MD;  Location: [facility];  Service: Gynecology;;   DILATATION & CURRETTAGE/HYSTEROSCOPY WITH RESECTOCOPE N/A 04/02/2015   Procedure: DILATATION & CURETTAGE/HYSTEROSCOPY WITH RESECTOCOPE;  Surgeon: Charlie Flowers, MD;  Location: [facility];  Service: Gynecology;  Laterality: N/A;   HYSTEROSCOPY WITH NOVASURE N/A 03/22/2019   Procedure: HYSTEROSCOPY WITH POLYPECTOMY AND DILATATION AND CURRETTAGE;  Surgeon: Yalcinkaya, Tamer, MD;  Location: [facility];  Service: Gynecology;  Laterality: N/A;   HYSTEROSCOPY WITH RESECTOSCOPE  11-11-2009   dr flowers @[facility]    polypectomy's   LAPAROSCOPIC CHOLECYSTECTOMY  09-28-2008   dr b. sebastian  @[facility]    ROBOTIC ASSISTED TOTAL HYSTERECTOMY WITH BILATERAL SALPINGO OOPHERECTOMY N/A 02/17/2024   Procedure: HYSTERECTOMY, TOTAL, ROBOT-ASSISTED, LAPAROSCOPIC, WITH BILATERAL  SALPINGO-OOPHORECTOMY;  Surgeon: Viktoria Comer SAUNDERS,  MD;  Location: [facility];  Service: Gynecology;  Laterality: N/A;   TONSILLECTOMY AND ADENOIDECTOMY  1984  approx.    Family History  Problem Relation Age of Onset   Hypertension Mother    Liver cancer Mother    Hypertension Father    Breast cancer Sister 4   Breast cancer Paternal Grandmother    Ovarian cancer Paternal Grandmother    Colon cancer Neg Hx    Pancreatic cancer Neg Hx    Prostate cancer Neg Hx    Endometrial cancer Neg Hx     Social History   Socioeconomic History   Marital status: Married    Spouse name: Not on file   Number of children: Not on file   Years of education: Not on file   Highest education level: Some college, no degree  Occupational History   Not on file  Tobacco Use   Smoking status: Never   Smokeless tobacco: Never  Vaping Use   Vaping status: Never Used  Substance and Sexual Activity   Alcohol use: No    Alcohol/week: 0.0 standard drinks of alcohol   Drug use: Never   Sexual activity: Yes    Birth control/protection: None  Other Topics Concern   Not on file  Social History Narrative   Married for 17 years    No children   Work for [employer] for 17 years   Enjoys reading   From [employer]   Social Drivers of [employer] Strain: Low Risk  (05/01/2023)   Overall Financial Resource Strain (CARDIA)    Difficulty of Paying Living Expenses: Not hard at all  Food Insecurity: No Food Insecurity (02/17/2024)   Hunger Vital Sign    Worried About Running Out of Food in the Last Year: Never true    Ran Out of Food in the Last Year: Never true  Transportation Needs: No Transportation Needs (02/17/2024)   PRAPARE - Administrator, Civil Service (Medical): No    Lack of Transportation (Non-Medical): No  Physical Activity: Insufficiently Active (05/01/2023)   Exercise Vital Sign    Days of Exercise per Week: 1 day    Minutes of Exercise per Session: 10 min  Stress: No Stress Concern Present (05/01/2023)   [employer] of Occupational Health - Occupational Stress Questionnaire    Feeling of Stress : Not at all  Social Connections: Moderately Integrated (05/01/2023)   Social Connection and Isolation Panel    Frequency of Communication with Friends and Family: More than three times a week    Frequency of Social Gatherings with Friends and Family: Once a week    Attends Religious Services: More than 4 times per year    Active Member of [employer] or Organizations: No    Attends Engineer, structural: Not on file    Marital Status: Married    Current Medications:  Current Outpatient Medications:    ibuprofen  (ADVIL ) 800 MG tablet, Take 1 tablet (800 mg total) by mouth every 8 (eight) hours as needed for moderate pain (pain score 4-6). For AFTER surgery only, Disp: 30 tablet, Rfl: 0   oxyCODONE  (OXY IR/ROXICODONE ) 5 MG immediate release tablet, Take 1 tablet (5 mg total) by mouth every 4 (four) hours as needed for severe pain (pain score 7-10). For AFTER surgery only, do not take and drive, Disp: 15 tablet, Rfl: 0   senna-docusate (SENOKOT-S) 8.6-50 MG tablet, Take 2 tablets by mouth  at bedtime. For AFTER surgery, do not take if having diarrhea, Disp: 30 tablet, Rfl: 0  Review of Systems: Denies appetite changes, fevers, chills, fatigue, unexplained weight changes. Denies hearing loss, neck lumps or masses, mouth sores, ringing in ears or voice changes. Denies cough or wheezing.  Denies shortness of breath. Denies chest pain or palpitations. Denies leg swelling. Denies abdominal distention, pain, blood in stools, constipation, diarrhea, nausea, vomiting, or early satiety. Denies pain with intercourse, dysuria, frequency, hematuria or incontinence. Denies hot flashes, pelvic pain, vaginal bleeding or vaginal discharge.   Denies joint pain, back pain or muscle pain/cramps. Denies itching, rash, or wounds. Denies dizziness, headaches, numbness or seizures. Denies swollen lymph nodes or glands,  denies easy bruising or bleeding. Denies anxiety, depression, confusion, or decreased concentration.  Physical Exam: BP 131/67 (BP Location: Right Arm, Patient Position: Sitting)   Pulse 79   Temp 97.9 F (36.6 C) (Oral)   Resp 20   Wt 298 lb 12.8 oz (135.5 kg)   SpO2 99%   BMI 56.46 kg/m  General: Alert, oriented, no acute distress. HEENT: Posterior oropharynx clear, sclera anicteric. Chest: Unlabored breathing on room air. Abdomen: Obese, soft, nontender.  Normoactive bowel sounds.  No masses or hepatosplenomegaly appreciated.  Well-healed incisions. Extremities: Grossly normal range of motion.  Warm, well perfused.  No edema bilaterally. GU: Normal appearing external genitalia without erythema, excoriation, or lesions.  Speculum exam reveals cuff intact, suture visible.  No bleeding or discharge within the vaginal vault.  Bimanual exam reveals cuff intact, no fluctuance or tenderness to palpation.  Laboratory & Radiologic Studies: A. 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   Assessment & Plan: Tracy Henry is a 48 y.o. 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   Patient is doing well postoperatively.  Discussed continued expectations and restrictions.  We reviewed pathology from surgery.  She was given a copy  of the pathology report.  Given final diagnosis of EIN, benign adnexal masses, discussed discharge to her OB/GYN and other healthcare providers.    She denies any menopausal symptoms including hot flashes.  Given risk factors for estrogen replacement, I do not recommend starting this based on lack of symptoms.  20 minutes of total time was spent for this patient encounter, including preparation, face-to-face counseling with the patient and coordination of care, and documentation of the encounter.  Comer Dollar, MD  Division of Gynecologic Oncology  Department of Obstetrics and Gynecology  [facility]

## 2024-03-24 NOTE — Patient Instructions (Signed)
 It was good to see you today.  You are healing very well from surgery.  Please remember, no heavy lifting for 6 weeks after surgery.  This means no lifting, pushing, or pulling more than 10-15 pounds.  After 6 weeks, please increase slowly as you start lifting heavier things.  You are on pelvic rest until 12 weeks.  This means nothing in the vagina until 12 weeks after surgery.  A little bit of spotting here and there over the next 3-4 months would be normal as the stitches start to dissolve.  If you are having more than just a little spotting or bleeding continues for a longer period of time, please call my office.  Please don't hesitate to reach out if you need anything in the future.  Our clinic number is [phone].
